# Patient Record
Sex: Male | Born: 1985
Health system: Southern US, Community
[De-identification: ages and names within clinical notes are randomized; demographics above are authoritative.]

## PROBLEM LIST (undated history)

## (undated) DIAGNOSIS — R768 Other specified abnormal immunological findings in serum: Secondary | ICD-10-CM

## (undated) DIAGNOSIS — R7401 Elevation of levels of liver transaminase levels: Secondary | ICD-10-CM

## (undated) DIAGNOSIS — R638 Other symptoms and signs concerning food and fluid intake: Secondary | ICD-10-CM

## (undated) DIAGNOSIS — E785 Hyperlipidemia, unspecified: Secondary | ICD-10-CM

## (undated) DIAGNOSIS — K589 Irritable bowel syndrome without diarrhea: Secondary | ICD-10-CM

## (undated) DIAGNOSIS — J45909 Unspecified asthma, uncomplicated: Secondary | ICD-10-CM

## (undated) HISTORY — DX: Other specified abnormal immunological findings in serum: R76.8

## (undated) HISTORY — DX: Elevation of levels of liver transaminase levels: R74.01

## (undated) HISTORY — DX: Hyperlipidemia, unspecified: E78.5

## (undated) HISTORY — PX: WISDOM TOOTH EXTRACTION: SHX21

## (undated) HISTORY — DX: Other symptoms and signs concerning food and fluid intake: R63.8

---
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
## 2009-09-27 ENCOUNTER — Emergency Department: Payer: Self-pay | Admitting: Internal Medicine

## 2013-04-21 ENCOUNTER — Emergency Department: Payer: Self-pay | Admitting: Emergency Medicine

## 2015-03-02 ENCOUNTER — Emergency Department
Admission: EM | Admit: 2015-03-02 | Discharge: 2015-03-02 | Disposition: A | Discharge status: Home / Self Care | Payer: Self-pay

## 2015-03-02 ENCOUNTER — Encounter: Payer: Self-pay | Admitting: General Practice

## 2015-03-02 HISTORY — DX: Unspecified asthma, uncomplicated: J45.909

## 2015-03-02 HISTORY — DX: Irritable bowel syndrome, unspecified: K58.9

## 2015-03-02 NOTE — ED Notes (Signed)
Pt informed to return if any life threatening symptoms occur.  

## 2015-03-02 NOTE — ED Notes (Signed)
Pt informed to return if any life threatening symptoms occur.  Pt discharged from
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
## 2015-04-27 ENCOUNTER — Encounter: Payer: Self-pay | Admitting: Family Medicine

## 2015-04-27 ENCOUNTER — Ambulatory Visit (INDEPENDENT_AMBULATORY_CARE_PROVIDER_SITE_OTHER): Payer: BLUE CROSS/BLUE SHIELD | Admitting: Family Medicine

## 2015-04-27 VITALS — BP 139/84 | HR 71 | Temp 97.9°F | Resp 16 | Ht 78.0 in | Wt 273.6 lb

## 2015-04-27 DIAGNOSIS — B009 Herpesviral infection, unspecified: Secondary | ICD-10-CM

## 2015-04-27 MED ORDER — VALACYCLOVIR HCL 500 MG PO TABS: 500.0000 mg | ORAL_TABLET | Freq: Every day | ORAL | Status: DC

## 2015-04-27 NOTE — Progress Notes (Signed)
Subjective:    Patient ID: David Luna, male    DOB: 1986/05/27, 29 y.o.   MRN: [id number]  HPI: David Luna is a 29 y.o. male presenting on 04/27/2015 for Medication Refill   HPI  Pt presents to refill suppressive therapy for herpes. He has had no outbreaks. Maintains monogamous relationship with discordant partner and they would both like him to stay on therapy. No side effects from medication tolerating well. He has not done a trial off the medication.   Past Medical History  Diagnosis Date  . IBS (irritable bowel syndrome)   . HSV-2 seropositive   . Asthma     As a small child. Pt reports he outgrew.     No current outpatient prescriptions on file prior to visit.   No current facility-administered medications on file prior to visit.    Review of Systems  Constitutional: Negative for fever and chills.  HENT: Negative.   Respiratory: Negative for chest tightness, shortness of breath and wheezing.   Cardiovascular: Negative for chest pain, palpitations and leg swelling.  Gastrointestinal: Negative for nausea, vomiting and abdominal pain.  Endocrine: Negative.   Genitourinary: Negative for dysuria, urgency, discharge, penile pain and testicular pain.  Musculoskeletal: Negative for back pain, joint swelling and arthralgias.  Skin: Negative.   Neurological: Negative for dizziness, weakness, numbness and headaches.  Psychiatric/Behavioral: Negative for sleep disturbance and dysphoric mood.   Per HPI unless specifically indicated above     Objective:    BP 139/84 mmHg  Pulse 71  Temp(Src) 97.9 F (36.6 C) (Oral)  Resp 16  Ht 6\' 6"  (1.981 m)  Wt 273 lb 9.6 oz (124.104 kg)  BMI 31.62 kg/m2  Wt Readings from Last 3 Encounters:  04/27/15 273 lb 9.6 oz (124.104 kg)  03/02/15 255 lb (115.667 kg)    Physical Exam  Constitutional: He is oriented to person, place, and time. He appears well-developed and well-nourished. No distress.  HENT:  Head: Normocephalic  and atraumatic.  Neck: Normal range of motion. Neck supple.  Cardiovascular: Normal rate, regular rhythm and normal heart sounds.  Exam reveals no gallop and no friction rub.   No murmur heard. Pulmonary/Chest: Effort normal and breath sounds normal. He has no wheezes.  Musculoskeletal: Normal range of motion. He exhibits no edema or tenderness.  Neurological: He is alert and oriented to person, place, and time. He has normal reflexes.  Skin: Skin is warm and dry. No rash noted. No erythema.  Psychiatric: He has a normal mood and affect. His behavior is normal. Thought content normal.   No results found for this or any previous visit.    Assessment & Plan:   Problem List Items Addressed This Visit      Other   Herpes simplex type 2 infection - Primary    Pt would like to continue suppressive therapy due to discordant partner. Discussed risks and benefits. Suggested trial  off medication to see if outbreaks occur. Pt plans to stop medication if he is ever out of relationship.  Renewed valtrex once daily.       Relevant Medications   valACYclovir (VALTREX) 500 MG tablet      Meds ordered this encounter  Medications  . DISCONTD: acyclovir (ZOVIRAX) 200 MG capsule    Sig: Take by mouth daily.  . valACYclovir (VALTREX) 500 MG tablet    Sig: Take 1 tablet (500 mg total) by mouth daily.    Dispense:  30 tablet    Refill:  11    Order Specific Question:  Supervising Provider    Answer:  Janeann Forehand [[id number]]      Follow up plan: Return if symptoms worsen or fail to improve.

## 2015-04-27 NOTE — Patient Instructions (Signed)
Keep taking the medication daily for suppressive therapy. You may stop taking the medication when you are not in a relationship or for a trial to see if you have an outbreak. I recommend using protection for all sexual contact at that time.

## 2015-04-27 NOTE — Assessment & Plan Note (Signed)
Pt would like to continue suppressive therapy due to discordant partner. Discussed risks and benefits. Suggested trial  off medication to see if outbreaks occur. Pt plans to stop medication if he is ever out of relationship.  Renewed valtrex once daily.

## 2016-08-10 ENCOUNTER — Emergency Department (HOSPITAL_COMMUNITY)
Admission: EM | Admit: 2016-08-10 | Discharge: 2016-08-11 | Disposition: A | Discharge status: Home / Self Care | Payer: BLUE CROSS/BLUE SHIELD | Attending: Emergency Medicine | Admitting: Emergency Medicine

## 2016-08-10 ENCOUNTER — Emergency Department (HOSPITAL_COMMUNITY): Payer: BLUE CROSS/BLUE SHIELD

## 2016-08-10 ENCOUNTER — Encounter (HOSPITAL_COMMUNITY): Payer: Self-pay | Admitting: Emergency Medicine

## 2016-08-10 DIAGNOSIS — Y9389 Activity, other specified: Secondary | ICD-10-CM | POA: Diagnosis not present

## 2016-08-10 DIAGNOSIS — S91114A Laceration without foreign body of right lesser toe(s) without damage to nail, initial encounter: Secondary | ICD-10-CM | POA: Diagnosis not present

## 2016-08-10 DIAGNOSIS — W228XXA Striking against or struck by other objects, initial encounter: Secondary | ICD-10-CM | POA: Insufficient documentation

## 2016-08-10 DIAGNOSIS — T1490XA Injury, unspecified, initial encounter: Secondary | ICD-10-CM

## 2016-08-10 DIAGNOSIS — J45909 Unspecified asthma, uncomplicated: Secondary | ICD-10-CM | POA: Diagnosis not present

## 2016-08-10 DIAGNOSIS — Y999 Unspecified external cause status: Secondary | ICD-10-CM | POA: Diagnosis not present

## 2016-08-10 DIAGNOSIS — Y929 Unspecified place or not applicable: Secondary | ICD-10-CM | POA: Diagnosis not present

## 2016-08-10 DIAGNOSIS — M79671 Pain in right foot: Secondary | ICD-10-CM | POA: Diagnosis not present

## 2016-08-10 NOTE — ED Triage Notes (Signed)
Patient was making the bed and hit right second toe. Patient got cut. Patient does not know what he got cut with. Patient came because he states he could not get it to stop bleeding.

## 2016-08-11 MED ORDER — LIDOCAINE HCL (PF) 1 % IJ SOLN
5.0000 mL | Freq: Once | INTRAMUSCULAR | Status: DC
  Filled 2016-08-11: qty 30

## 2016-08-11 NOTE — Discharge Instructions (Signed)
Please keep the wound clean and dry for the next 24 hours. May clean with mild soap and water do not scrub. Do not soak the toe in water. He may apply antibiotic ointment as needed. The stitches will need to come out in 10-14 days. Watch out for signs of infection including redness, swelling, draining of thick fluid. Return to the ED if you develop any of these symptoms. Follow up with her primary care doctor for wound recheck next week.

## 2016-08-11 NOTE — ED Provider Notes (Signed)
[facility]-EMERGENCY DEPT Provider Note   CSN: [id number] Arrival date & time: 08/10/16  2309     History   Chief Complaint Chief Complaint  Patient presents with  . Toe Injury    HPI David Luna is a 30 y.o. male.   30 year old Caucasian male with no significant past medical history presents to the ED today with right second toe laceration that occurred pta. Patient states that he was making the bed tonight his right second toe on the bed frame. He was unable to control bleeding which is why he came to the ED. Pain is minimal. Patient has full range of motion and sensation of the toe. Pain was acute in onset. Nothing makes better or worse. He is not taking anything for the pain at home. States his tetanus is up-to-date. Denies any symptoms at this time.       Past Medical History:  Diagnosis Date  . Asthma    As a small child. Pt reports he outgrew.   Marland Kitchen HSV-2 seropositive   . IBS (irritable bowel syndrome)     Patient Active Problem List   Diagnosis Date Noted  . Herpes simplex type 2 infection 04/27/2015    History reviewed. No pertinent surgical history.     Home Medications    Prior to Admission medications   Medication Sig Start Date End Date Taking? Authorizing Provider  valACYclovir (VALTREX) 500 MG tablet Take 1 tablet (500 mg total) by mouth daily. 04/27/15   Amy Rusty Aus, NP    Family History Family History  Problem Relation Age of Onset  . Hypertension Mother     Social History Social History  Substance Use Topics  . Smoking status: Never Smoker  . Smokeless tobacco: Never Used  . Alcohol use Yes     Comment: Casual     Allergies   Patient has no known allergies.   Review of Systems Review of Systems  Constitutional: Negative for chills and fever.  Musculoskeletal: Negative for arthralgias and joint swelling.  Skin: Positive for wound.  All other systems reviewed and are negative.    Physical Exam Updated Vital Signs BP  127/89 (BP Location: Right Arm)   Pulse 76   Temp 97.7 F (36.5 C) (Oral)   Resp 18   Ht 6\' 6"  (1.981 m)   Wt 124.3 kg   SpO2 97%   BMI 31.66 kg/m   Physical Exam  Constitutional: He appears well-developed and well-nourished. No distress.  Eyes: Right eye exhibits no discharge. Left eye exhibits no discharge. No scleral icterus.  Pulmonary/Chest: No respiratory distress.  Musculoskeletal: Normal range of motion.  1 cm laceration noted to the right second toe that does not involve the nailbed. The nail and nailbed are intact. Laceration is to the right lateral side of the toe. Very superficial. Wound is more of an avulsion with a skin flap. Bleeding is controlled. Minimal edema noted. No erythema or ecchymosis noted. Patient with full range of motion. Cap refill is normal. Sensation is intact. Full range of motion. DP pulses are 2+ bilaterally.  Neurological: He is alert.  Skin: Skin is warm and dry. Capillary refill takes less than 2 seconds. No pallor.  Nursing note and vitals reviewed.    ED Treatments / Results  Labs (all labs ordered are listed, but only abnormal results are displayed) Labs Reviewed - No data to display  EKG  EKG Interpretation None       Radiology Dg Foot Complete Right  Result Date: 08/11/2016 CLINICAL DATA:  Stubbed second digit with pain EXAM: RIGHT FOOT COMPLETE - 3+ VIEW COMPARISON:  None. FINDINGS: No fracture or subluxation. Soft tissue injury distal second digit. No radiopaque foreign body. IMPRESSION: No acute osseous abnormality. Electronically Signed   By: Jasmine PangKim  Fujinaga M.D.   On: 08/11/2016 00:00    Procedures .Marland Kitchen.Laceration Repair Date/Time: 08/11/2016 1:22 AM Performed by: Rise MuLEAPHART, Ashlay Altieri T Authorized by: Demetrios LollLEAPHART, Keylin Ferryman T   Consent:    Consent obtained:  Verbal   Consent given by:  Patient   Risks discussed:  Infection, need for additional repair, nerve damage, pain, poor cosmetic result, poor wound healing, retained foreign  body, tendon damage and vascular damage   Alternatives discussed:  No treatment Anesthesia (see MAR for exact dosages):    Anesthesia method:  Nerve block   Block location:  Right second toe at the base   Block needle gauge:  25 G   Block anesthetic:  Lidocaine 1% w/o epi   Block technique:  Injection at the base on each side of the toe   Block injection procedure:  Anatomic landmarks identified, introduced needle, anatomic landmarks palpated, negative aspiration for blood and incremental injection   Block outcome:  Anesthesia achieved Laceration details:    Location:  Toe   Toe location:  R second toe   Length (cm):  1   Depth (mm):  1 Repair type:    Repair type:  Simple Pre-procedure details:    Preparation:  Patient was prepped and draped in usual sterile fashion and imaging obtained to evaluate for foreign bodies Exploration:    Hemostasis achieved with:  Direct pressure   Wound exploration: wound explored through full range of motion and entire depth of wound probed and visualized     Wound extent: no foreign bodies/material noted, no nerve damage noted and no tendon damage noted     Contaminated: no   Treatment:    Area cleansed with:  Betadine, Hibiclens and saline   Amount of cleaning:  Extensive   Irrigation solution:  Sterile saline   Irrigation volume:  50cc   Irrigation method:  Pressure wash   Visualized foreign bodies/material removed: no   Skin repair:    Repair method:  Sutures   Suture size:  6-0   Suture material:  Prolene   Number of sutures:  3 Approximation:    Approximation:  Loose   Vermilion border: poorly aligned   Post-procedure details:    Dressing:  Antibiotic ointment and bulky dressing   Patient tolerance of procedure:  Tolerated well, no immediate complications Comments:     Taced skin flap down with 3 sutures. Follow up in 10-14 days for suture removal.   (including critical care time)  Medications Ordered in ED Medications  lidocaine  (PF) (XYLOCAINE) 1 % injection 5 mL (not administered)     Initial Impression / Assessment and Plan / ED Course  I have reviewed the triage vital signs and the nursing notes.  Pertinent labs & imaging results that were available during my care of the patient were reviewed by me and considered in my medical decision making (see chart for details).  Clinical Course   UTD on Tdap. Pressure irrigation performed. Laceration occurred < 8 hours prior to repair which was well tolerated. Pt has no co morbidities to effect normal wound healing. Xray unremarkable. Discussed suture home care w pt and answered questions. Pt to f-u for wound check and suture removal in 10-14 days. Pt  is hemodynamically stable w no complaints prior to dc.  Patient seen and examined by Dr. Eudelia Bunchardama who is agreeable to the above plan.   Final Clinical Impressions(s) / ED Diagnoses   Final diagnoses:  Laceration of lesser toe of right foot without foreign body present or damage to nail, initial encounter    New Prescriptions New Prescriptions   No medications on file     Rise MuKenneth T Koralyn Prestage, PA-C 08/11/16 0128    Nira ConnPedro Eduardo Cardama, MD 08/11/16 1500

## 2016-08-11 NOTE — ED Notes (Signed)
PA at bedside.

## 2016-08-11 NOTE — ED Notes (Signed)
Pt injured toe cleaned with betadine.

## 2016-11-14 DIAGNOSIS — L570 Actinic keratosis: Secondary | ICD-10-CM | POA: Diagnosis not present

## 2016-11-15 ENCOUNTER — Ambulatory Visit: Payer: BLUE CROSS/BLUE SHIELD | Admitting: Dietician

## 2016-11-22 ENCOUNTER — Encounter: Payer: Self-pay | Admitting: Dietician

## 2016-11-22 ENCOUNTER — Encounter: Payer: BLUE CROSS/BLUE SHIELD | Attending: Family Medicine | Admitting: Dietician

## 2016-11-22 VITALS — Ht 78.0 in | Wt 278.0 lb

## 2016-11-22 DIAGNOSIS — Z6832 Body mass index (BMI) 32.0-32.9, adult: Secondary | ICD-10-CM | POA: Diagnosis not present

## 2016-11-22 DIAGNOSIS — E6609 Other obesity due to excess calories: Secondary | ICD-10-CM | POA: Diagnosis not present

## 2016-11-22 NOTE — Progress Notes (Signed)
Medical Nutrition Therapy: Visit start time: 9:40 am   end time: 10:40 Assessment:  Diagnosis: obesity  Psychosocial issues/ stress concerns: Patient describes his stress as moderate and indicates "very well" as to how well he is dealing with his stress.  Preferred learning method:  . Auditory Current weight: 278 lbs (wt. at home) Height: 78 in Medications, supplements:see list Progress and evaluation:  Patient in for initial medical nutrition therapy appointment. 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 He often packs his lunch when working. He eats out 7-8 times per week and states he typically makes high fat choices and has larger portions. He chooses sweetened beverages when eating "out". He and his wife have recently been trying the" mail order meals" for dinner which he states is helping with portion control. Typically drinks 1 soda daily at home but water otherwise for meals/snacks at home. He drinks 5-10 alcohol beverages weekly. His overall diet is low in fruits/vegetables, whole grains and calcium sources. He states he is interested in weight loss but wants to focus more on an overall healthier diet to improve health. He likes a limited variety of vegetables and fruits.   Physical activity: weights/cardio 2-4 times per wee for 45-60 minutes.  Dietary Intake:  Usual eating pattern includes 2-3 meals and 1-2 snacks per day. Dining out frequency: 7-8 meals per week.  Breakfast:yogurt, almonds, honey or Fast Food biscuits, potatoes when eating out,  20 oz. [place] Lunch:11:30- salad with onions, mushrooms, bacon, lite dressing, rice or quinoa with chicken; green beans or asparagus or Kale Snack: almonds; not daily Supper: 7:30pm ground beef, baked potato or tacos or spaghetti, salad or pizza or [place] Beverages: water- 4-5 cups per day, soda  (20oz) daily; more when dining out  Nutrition Care Education: Basic nutrition/ weight control: Instructed on basic food group servings to meet nutrient needs. Encouraged to focus on foods needed to meet nutrient needs verses restriction. Use food guide plate and "Planning a balanced meal" to show how to be more mindful of balancing protein, carbohydrate and non-starchy vegetables. Discussed practical ways to include more fruits/vegetables. Discussed strategies when dining out to decrease calories. Discussed phone apps to look up nutrition information especially for meals eaten "out" to help increase mindfulness in order to make better choices   Nutritional Diagnosis:  NI-1.5 Excessive energy intake As related to sweetened beverages, high fat choices when dining out and large portions.  As evidenced by diet history.. NI-5.11.1 Predicted suboptimal nutrient intake As related to low intake of fruits, vegetables, whole grains.  As evidenced by diet history.  Intervention:  Focus on healthier choices when eating "out". Limit fried foods. Work to add fruits/vegetables to meals. Try more dark green lettuce types such as romaine, green leafy or use spinach as salad base. Add grated carrots, purple cabbage etc to increase nutrients. Work to balance meals with protein, 3-5 servings of carbohydrate (45-75 gms), and non-starchy vegetables. Choose water or unsweetened tea when dining out. Use phone apps to look up nutrition information when eating out. Myfitnesspal for example   Education Materials given:  . Food lists/ Planning A Balanced Meal . Sample meal pattern/ menus . Goals/ instructions  Learner/ who was taught:  . Patient   Level of understanding: . Partial understanding; needs review/ practice Demonstrated degree of understanding via:  Teach back Learning barriers: . None  Willingness to learn/ readiness for change: . Acceptance, ready for change  Monitoring and Evaluation:   Dietary intake, exercise, , and body weight      follow up: 01/01/17 at 9:00am

## 2016-11-22 NOTE — Patient Instructions (Addendum)
Focus on healthier choices when eating "out". Limit fried foods. Work to add fruits/vegetables to meals. Try more dark green lettuce types such as romaine, green leafy or use spinach as salad base. Add grated carrots, purple cabbage etc to increase nutrients. Work to balance meals with protein, 3-5 servings of carbohydrate (45-75 gms), and non-starchy vegetables. Choose water or unsweetened tea when dining out. Use phone apps to look up nutrition information when eating out. Myfitnesspal for example

## 2017-01-01 ENCOUNTER — Ambulatory Visit: Payer: BLUE CROSS/BLUE SHIELD | Admitting: Dietician

## 2017-01-07 ENCOUNTER — Ambulatory Visit: Payer: BLUE CROSS/BLUE SHIELD | Admitting: Dietician

## 2017-01-09 DIAGNOSIS — E669 Obesity, unspecified: Secondary | ICD-10-CM | POA: Diagnosis not present

## 2017-01-09 DIAGNOSIS — E7800 Pure hypercholesterolemia, unspecified: Secondary | ICD-10-CM | POA: Diagnosis not present

## 2017-01-09 DIAGNOSIS — Z79899 Other long term (current) drug therapy: Secondary | ICD-10-CM | POA: Diagnosis not present

## 2017-01-09 DIAGNOSIS — K219 Gastro-esophageal reflux disease without esophagitis: Secondary | ICD-10-CM | POA: Diagnosis not present

## 2017-01-09 DIAGNOSIS — N529 Male erectile dysfunction, unspecified: Secondary | ICD-10-CM | POA: Diagnosis not present

## 2017-01-09 DIAGNOSIS — I10 Essential (primary) hypertension: Secondary | ICD-10-CM | POA: Diagnosis not present

## 2017-01-17 ENCOUNTER — Encounter: Payer: Self-pay | Admitting: Dietician

## 2017-03-27 DIAGNOSIS — Z3189 Encounter for other procreative management: Secondary | ICD-10-CM | POA: Diagnosis not present

## 2017-05-29 DIAGNOSIS — L570 Actinic keratosis: Secondary | ICD-10-CM | POA: Diagnosis not present

## 2017-06-21 DIAGNOSIS — Z23 Encounter for immunization: Secondary | ICD-10-CM | POA: Diagnosis not present

## 2017-07-24 DIAGNOSIS — I10 Essential (primary) hypertension: Secondary | ICD-10-CM | POA: Diagnosis not present

## 2017-07-24 DIAGNOSIS — K219 Gastro-esophageal reflux disease without esophagitis: Secondary | ICD-10-CM | POA: Diagnosis not present

## 2017-07-24 DIAGNOSIS — E7800 Pure hypercholesterolemia, unspecified: Secondary | ICD-10-CM | POA: Diagnosis not present

## 2017-08-08 IMAGING — CR DG FOOT COMPLETE 3+V*R*
3 series · 3 of 3 positions shown · non-contrast
Comparison: None.

CLINICAL DATA: Stubbed second digit with pain

EXAM:
RIGHT FOOT COMPLETE - 3+ VIEW

[x foot ap right]
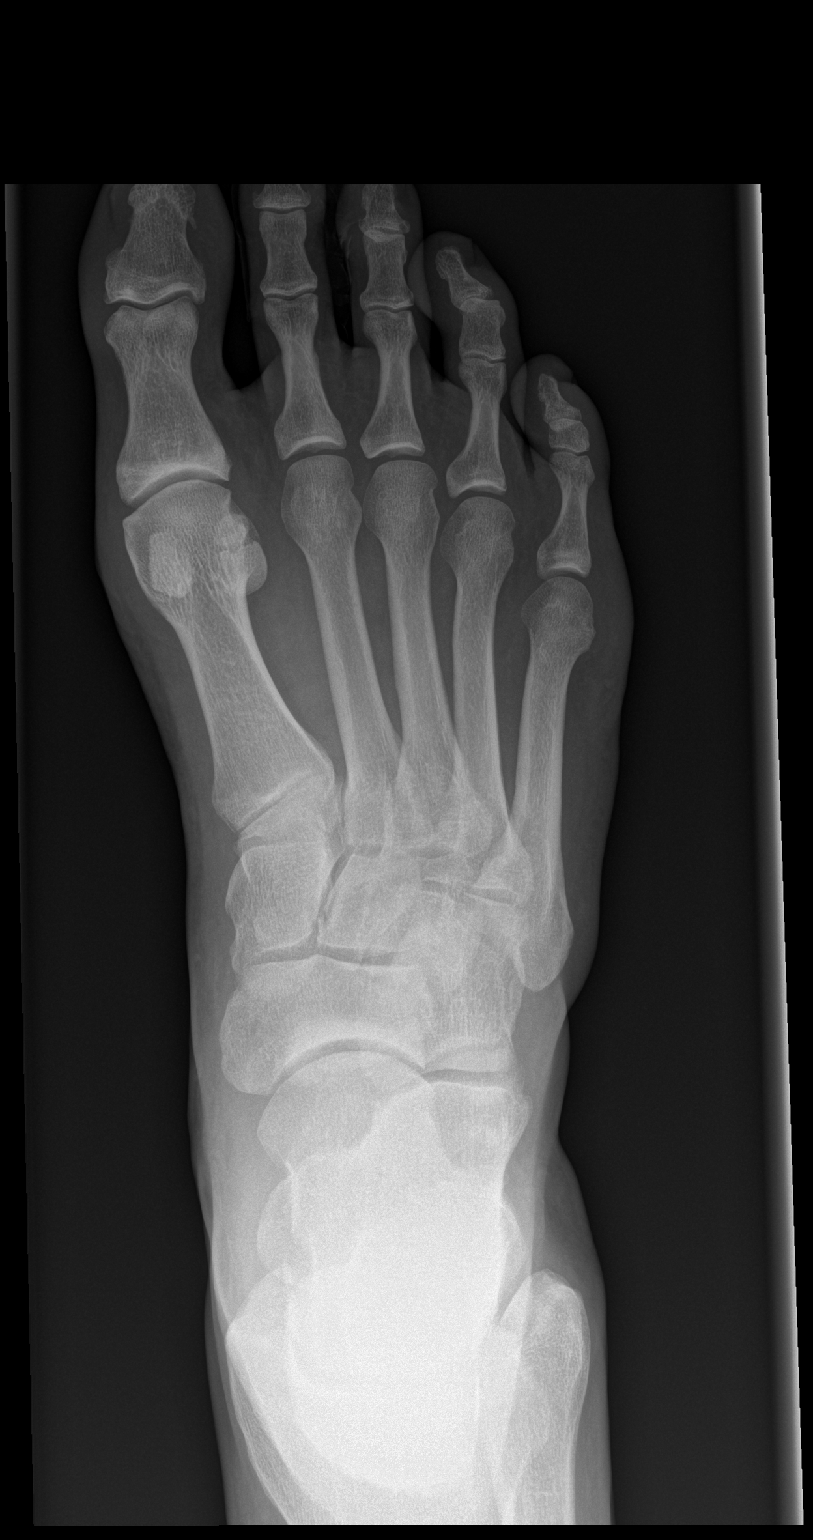

[x foot obl right]
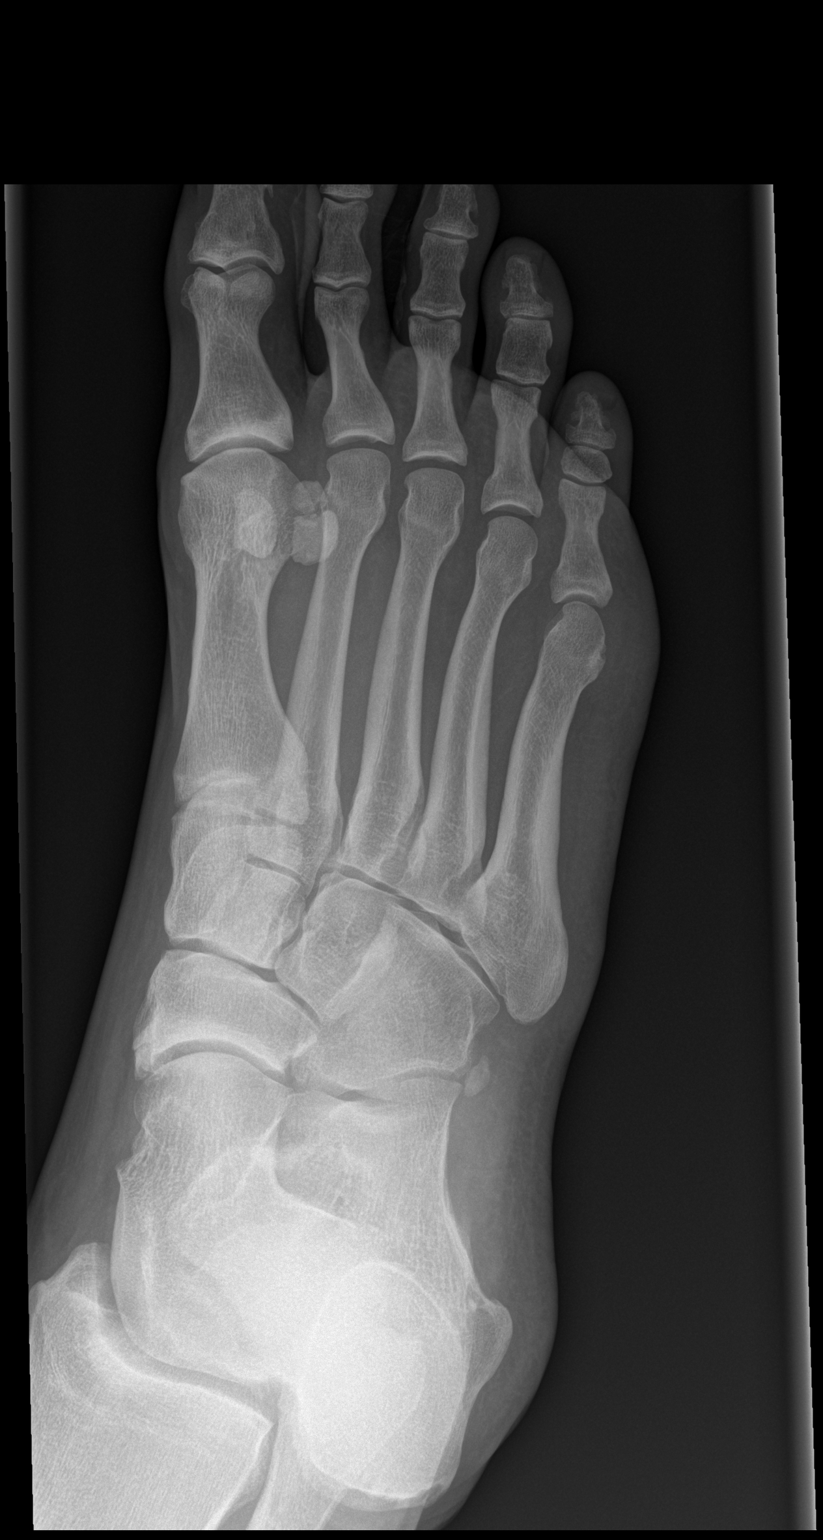

[x foot lat right]
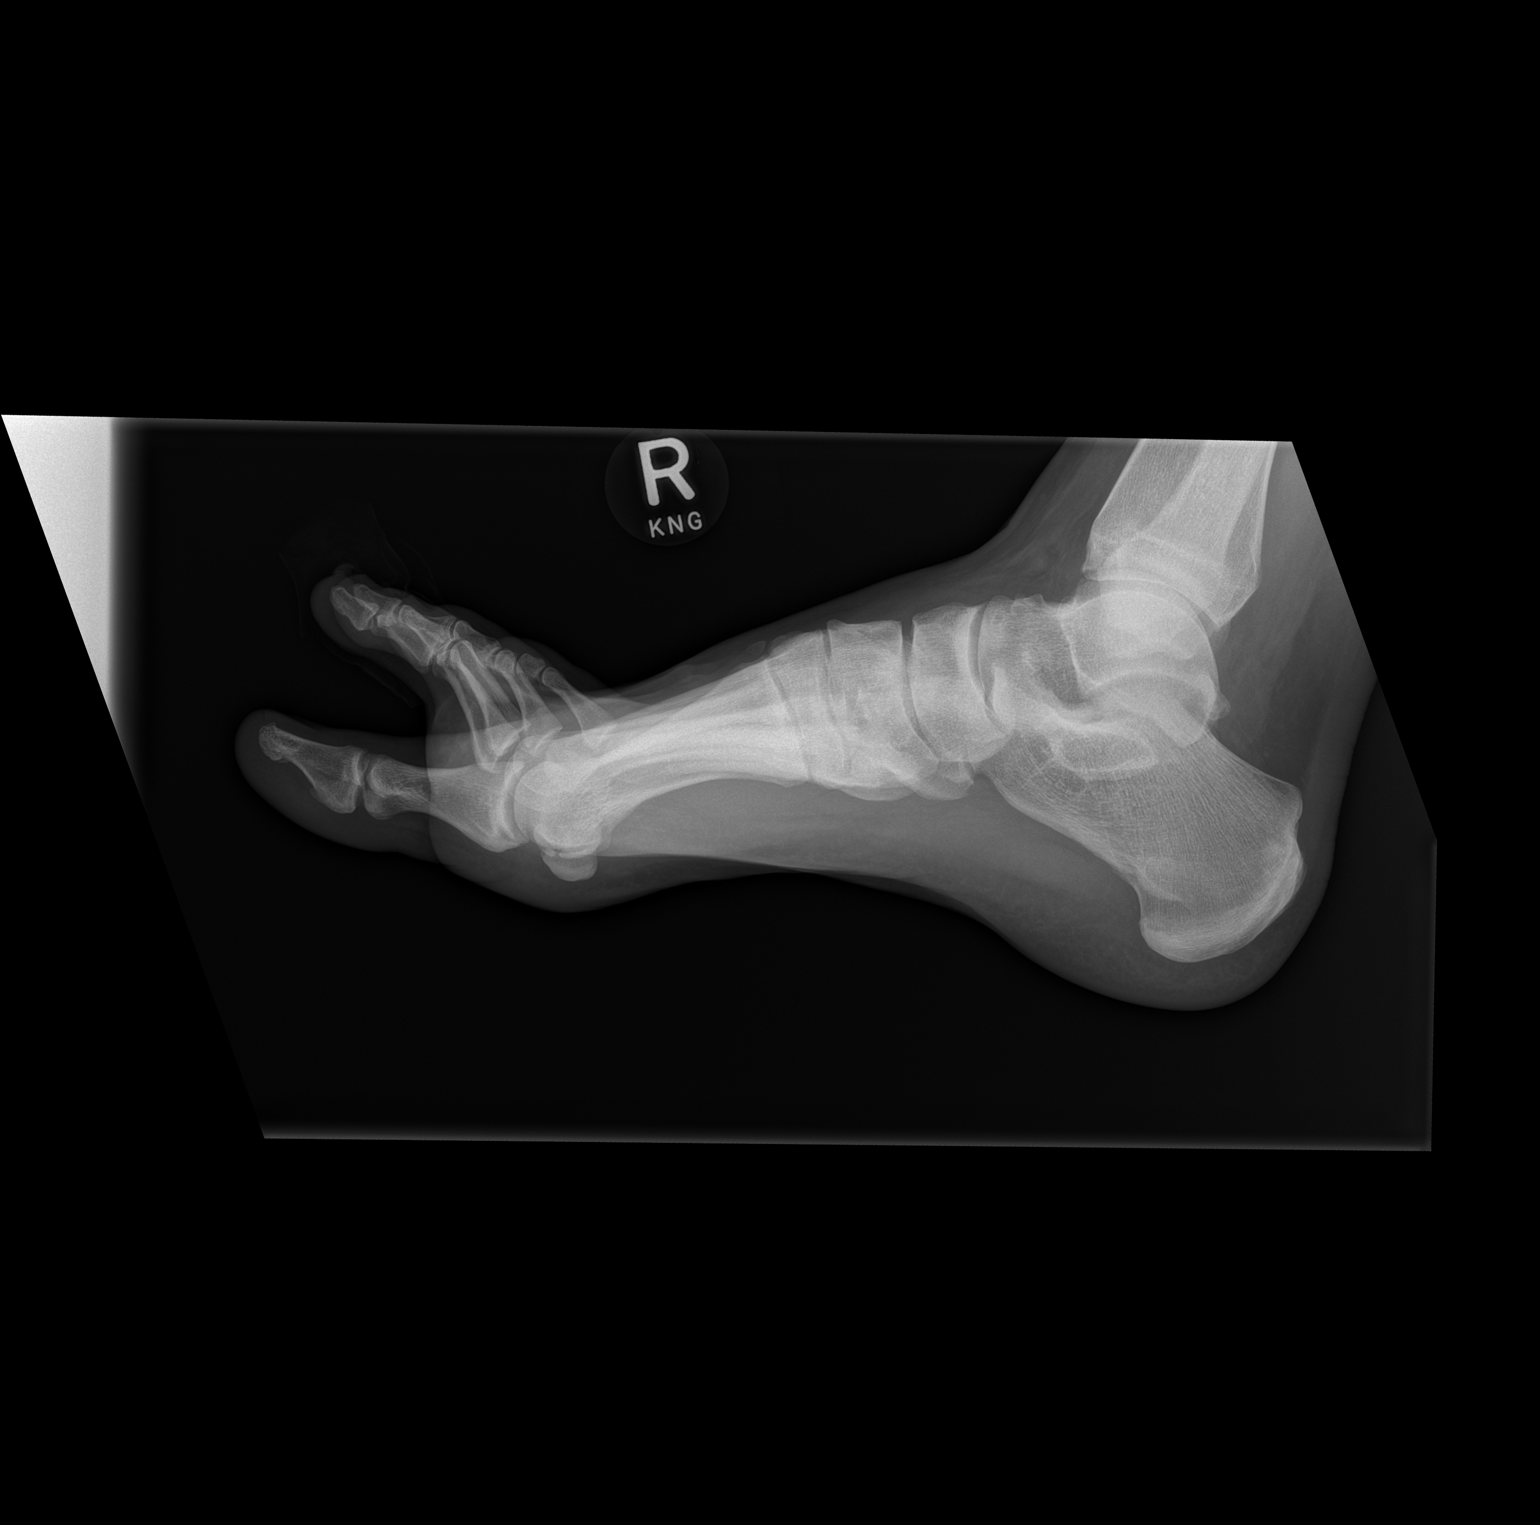

[3 of 3 positions shown; findings below may reference images not displayed]

FINDINGS: No fracture or subluxation. Soft tissue injury distal second digit.
No radiopaque foreign body.
IMPRESSION: No acute osseous abnormality.

## 2017-08-12 DIAGNOSIS — H6120 Impacted cerumen, unspecified ear: Secondary | ICD-10-CM | POA: Diagnosis not present

## 2017-08-12 DIAGNOSIS — Z1212 Encounter for screening for malignant neoplasm of rectum: Secondary | ICD-10-CM | POA: Diagnosis not present

## 2017-08-12 DIAGNOSIS — Z0000 Encounter for general adult medical examination without abnormal findings: Secondary | ICD-10-CM | POA: Diagnosis not present

## 2017-08-12 DIAGNOSIS — E669 Obesity, unspecified: Secondary | ICD-10-CM | POA: Diagnosis not present

## 2017-08-12 DIAGNOSIS — G4700 Insomnia, unspecified: Secondary | ICD-10-CM | POA: Diagnosis not present

## 2017-08-12 DIAGNOSIS — Z79899 Other long term (current) drug therapy: Secondary | ICD-10-CM | POA: Diagnosis not present

## 2017-08-12 DIAGNOSIS — Z23 Encounter for immunization: Secondary | ICD-10-CM | POA: Diagnosis not present

## 2017-08-21 DIAGNOSIS — M799 Soft tissue disorder, unspecified: Secondary | ICD-10-CM | POA: Diagnosis not present

## 2017-08-21 DIAGNOSIS — Z6834 Body mass index (BMI) 34.0-34.9, adult: Secondary | ICD-10-CM | POA: Diagnosis not present

## 2017-08-24 DIAGNOSIS — R1901 Right upper quadrant abdominal swelling, mass and lump: Secondary | ICD-10-CM | POA: Diagnosis not present

## 2017-08-24 DIAGNOSIS — M799 Soft tissue disorder, unspecified: Secondary | ICD-10-CM | POA: Diagnosis not present

## 2017-12-17 DIAGNOSIS — L570 Actinic keratosis: Secondary | ICD-10-CM | POA: Diagnosis not present

## 2018-02-07 DIAGNOSIS — I10 Essential (primary) hypertension: Secondary | ICD-10-CM | POA: Diagnosis not present

## 2018-02-07 DIAGNOSIS — K219 Gastro-esophageal reflux disease without esophagitis: Secondary | ICD-10-CM | POA: Diagnosis not present

## 2018-02-07 DIAGNOSIS — E7800 Pure hypercholesterolemia, unspecified: Secondary | ICD-10-CM | POA: Diagnosis not present

## 2018-02-12 DIAGNOSIS — Z1331 Encounter for screening for depression: Secondary | ICD-10-CM | POA: Diagnosis not present

## 2018-02-12 DIAGNOSIS — K219 Gastro-esophageal reflux disease without esophagitis: Secondary | ICD-10-CM | POA: Diagnosis not present

## 2018-02-12 DIAGNOSIS — Z79899 Other long term (current) drug therapy: Secondary | ICD-10-CM | POA: Diagnosis not present

## 2018-02-12 DIAGNOSIS — N529 Male erectile dysfunction, unspecified: Secondary | ICD-10-CM | POA: Diagnosis not present

## 2018-02-12 DIAGNOSIS — E7800 Pure hypercholesterolemia, unspecified: Secondary | ICD-10-CM | POA: Diagnosis not present

## 2018-02-12 DIAGNOSIS — E669 Obesity, unspecified: Secondary | ICD-10-CM | POA: Diagnosis not present

## 2018-02-12 DIAGNOSIS — I10 Essential (primary) hypertension: Secondary | ICD-10-CM | POA: Diagnosis not present

## 2018-02-27 LAB — LIPID PANEL
Cholesterol: 256 — AB (ref 0–200)
HDL: 44 (ref 35–70)
LDL Cholesterol: 178
Triglycerides: 170 — AB (ref 40–160)

## 2018-02-27 LAB — HEPATIC FUNCTION PANEL
ALT: 45 — AB (ref 10–40)
AST: 22 (ref 14–40)

## 2018-06-17 DIAGNOSIS — L570 Actinic keratosis: Secondary | ICD-10-CM | POA: Diagnosis not present

## 2018-07-03 DIAGNOSIS — Z23 Encounter for immunization: Secondary | ICD-10-CM | POA: Diagnosis not present

## 2018-07-21 DIAGNOSIS — L723 Sebaceous cyst: Secondary | ICD-10-CM | POA: Diagnosis not present

## 2018-07-21 DIAGNOSIS — L821 Other seborrheic keratosis: Secondary | ICD-10-CM | POA: Diagnosis not present

## 2018-08-15 DIAGNOSIS — I10 Essential (primary) hypertension: Secondary | ICD-10-CM | POA: Diagnosis not present

## 2018-08-15 DIAGNOSIS — K219 Gastro-esophageal reflux disease without esophagitis: Secondary | ICD-10-CM | POA: Diagnosis not present

## 2018-08-15 DIAGNOSIS — E7800 Pure hypercholesterolemia, unspecified: Secondary | ICD-10-CM | POA: Diagnosis not present

## 2018-08-20 DIAGNOSIS — Z79899 Other long term (current) drug therapy: Secondary | ICD-10-CM | POA: Diagnosis not present

## 2018-08-20 DIAGNOSIS — E669 Obesity, unspecified: Secondary | ICD-10-CM | POA: Diagnosis not present

## 2018-08-20 DIAGNOSIS — K219 Gastro-esophageal reflux disease without esophagitis: Secondary | ICD-10-CM | POA: Diagnosis not present

## 2018-08-20 DIAGNOSIS — E7800 Pure hypercholesterolemia, unspecified: Secondary | ICD-10-CM | POA: Diagnosis not present

## 2018-08-20 DIAGNOSIS — I10 Essential (primary) hypertension: Secondary | ICD-10-CM | POA: Diagnosis not present

## 2018-08-20 DIAGNOSIS — N529 Male erectile dysfunction, unspecified: Secondary | ICD-10-CM | POA: Diagnosis not present

## 2018-09-02 LAB — LIPID PANEL
Cholesterol: 221 — AB (ref 0–200)
HDL: 37 (ref 35–70)
LDL Cholesterol: 163
Triglycerides: 103 (ref 40–160)

## 2018-09-02 LAB — HEPATIC FUNCTION PANEL
ALT: 53 — AB (ref 10–40)
AST: 25 (ref 14–40)

## 2018-12-06 NOTE — Progress Notes (Signed)
Subjective:    Patient ID: David Luna, male    DOB: 24-Feb-1986, 33 y.o.   MRN: [id number]  HPI: David Luna is here to establish as a new pt.  He is a pleasant 33 year old male. 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 Healthcare maintenance 12/08/2018  . BMI 33.0-33.9,adult 12/08/2018  . Herpes simplex type 2 infection 04/27/2015     Past Medical History:  Diagnosis Date  . Asthma    As a small child. Pt reports he outgrew.   Marland Kitchen HSV-2 seropositive   . IBS (irritable bowel syndrome)      History reviewed. No pertinent surgical history.   Family History  Problem Relation Age of Onset  . Hypertension Mother      Social History   Substance and Sexual Activity  Drug Use No     Social History   Substance and Sexual Activity  Alcohol Use Yes  . Alcohol/week: 5.0 standard drinks  . Types: 5 Cans of beer per week     Social History   Tobacco Use  Smoking Status Never Smoker  Smokeless Tobacco Never Used     Outpatient Encounter Medications as of 12/08/2018  Medication Sig  . valACYclovir (VALTREX) 500 MG tablet Take 1 tablet (500 mg total) by mouth daily.   No facility-administered encounter medications on file as of 12/08/2018.     Allergies: Patient has no known allergies.  Body mass index is 33 kg/m.  Blood pressure 119/70, pulse 66,  temperature 97.8 F (36.6 C), temperature source Oral, height 6\' 6"  (1.981 m), weight 285 lb 9.6 oz (129.5 kg), SpO2 96 %.  Review of Systems  Constitutional: Positive for fatigue. Negative for activity change, appetite change, chills, diaphoresis, fever and unexpected weight change.  HENT: Negative for congestion.   Eyes: Negative for visual disturbance.  Respiratory: Negative for cough, chest tightness, shortness of breath, wheezing and stridor.   Cardiovascular: Negative for chest pain, palpitations and leg swelling.  Gastrointestinal: Negative for abdominal distention, anal bleeding, constipation, diarrhea, nausea and vomiting.  Endocrine: Negative for cold intolerance, heat intolerance, polydipsia, polyphagia and polyuria.  Genitourinary: Negative for difficulty urinating and frequency.  Musculoskeletal: Negative for arthralgias, back pain, gait problem, joint swelling, myalgias, neck pain and neck stiffness.  Skin: Negative for color change, pallor, rash and wound.  Neurological: Negative for dizziness and headaches.  Hematological: Does not bruise/bleed easily.  Psychiatric/Behavioral: Negative for agitation, behavioral problems, confusion, decreased concentration, dysphoric mood, hallucinations, self-injury, sleep disturbance and suicidal ideas. The patient is not nervous/anxious and is not hyperactive.        Objective:   Physical Exam Vitals signs reviewed.  Constitutional:      General: He is not in acute distress.    Appearance: Normal appearance. He is not ill-appearing, toxic-appearing or diaphoretic.  HENT:     Head: Normocephalic and atraumatic.  Cardiovascular:  Rate and Rhythm: Normal rate.     Pulses: Normal pulses.     Heart sounds: Normal heart sounds. No murmur. No friction rub. No gallop.   Pulmonary:     Effort: Pulmonary effort is normal. No respiratory distress.     Breath sounds: Normal breath sounds. No stridor. No wheezing, rhonchi or rales.  Chest:      Chest wall: No tenderness.  Skin:    General: Skin is warm and dry.     Capillary Refill: Capillary refill takes less than 2 seconds.  Neurological:     Mental Status: He is alert and oriented to person, place, and time.  Psychiatric:        Mood and Affect: Mood normal.        Behavior: Behavior normal.        Thought Content: Thought content normal.        Judgment: Judgment normal.       Assessment & Plan:   1. Healthcare maintenance   2. Herpes simplex type 2 infection   3. BMI 33.0-33.9,adult     Healthcare maintenance Please schedule annual follow-up appt in jan 2021 and bring paperwork from Dec Employer physical. Continue to drink plenty of fluids and follow Mediterranean diet. When you need refill on medication, please call your pharmacy and they will contact us.  Herpes simplex type 2 infection HSV2- he denies ever experiencing a genital herpes breakout, reports being dx'd via blood test 3 years ago He takes Valrex 500mg  QD for suppressive therapy, denies medication SE He denies need for medication RF today  BMI 33.0-33.9,adult Body mass index is 33 kg/m.  Current wt 285 Mediterranean diet He recently started Intermittent Fasting Continue regular Cardio Exercise     FOLLOW-UP:  Return in about 10 months (around 10/10/2019) for Regular Follow Up.

## 2018-12-08 ENCOUNTER — Other Ambulatory Visit: Payer: Self-pay

## 2018-12-08 ENCOUNTER — Encounter: Payer: Self-pay | Admitting: Adult Health

## 2018-12-08 ENCOUNTER — Ambulatory Visit (INDEPENDENT_AMBULATORY_CARE_PROVIDER_SITE_OTHER): Payer: BLUE CROSS/BLUE SHIELD | Admitting: Adult Health

## 2018-12-08 VITALS — BP 119/70 | HR 66 | Temp 97.8°F | Ht 78.0 in | Wt 285.6 lb

## 2018-12-08 DIAGNOSIS — Z6833 Body mass index (BMI) 33.0-33.9, adult: Secondary | ICD-10-CM

## 2018-12-08 DIAGNOSIS — Z0000 Encounter for general adult medical examination without abnormal findings: Secondary | ICD-10-CM | POA: Diagnosis not present

## 2018-12-08 DIAGNOSIS — B009 Herpesviral infection, unspecified: Secondary | ICD-10-CM | POA: Diagnosis not present

## 2018-12-08 NOTE — Assessment & Plan Note (Signed)
Body mass index is 33 kg/m.  Current wt 285 Mediterranean diet He recently started Intermittent Fasting Continue regular Cardio Exercise

## 2018-12-08 NOTE — Patient Instructions (Addendum)
Mediterranean Diet A Mediterranean diet refers to food and lifestyle choices that are based on the traditions of countries located on the [employer]. This way of eating has been shown to help prevent certain conditions and improve outcomes for people who have chronic diseases, like kidney disease and heart disease. What are tips for following this plan? Lifestyle  Cook and eat meals together with your family, when possible.  Drink enough fluid to keep your urine clear or pale yellow.  Be physically active every day. This includes: ? Aerobic exercise like running or swimming. ? Leisure activities like gardening, walking, or housework.  Get 7-8 hours of sleep each night.  If recommended by your health care provider, drink red wine in moderation. This means 1 glass a day for nonpregnant women and 2 glasses a day for men. A glass of wine equals 5 oz (150 mL). Reading food labels   Check the serving size of packaged foods. For foods such as rice and pasta, the serving size refers to the amount of cooked product, not dry.  Check the total fat in packaged foods. Avoid foods that have saturated fat or trans fats.  Check the ingredients list for added sugars, such as corn syrup. Shopping  At the grocery store, buy most of your food from the areas near the walls of the store. This includes: ? Fresh fruits and vegetables (produce). ? Grains, beans, nuts, and seeds. Some of these may be available in unpackaged forms or large amounts (in bulk). ? Fresh seafood. ? Poultry and eggs. ? Low-fat dairy products.  Buy whole ingredients instead of prepackaged foods.  Buy fresh fruits and vegetables in-season from local farmers markets.  Buy frozen fruits and vegetables in resealable bags.  If you do not have access to quality fresh seafood, buy precooked frozen shrimp or canned fish, such as tuna, salmon, or sardines.  Buy small amounts of raw or cooked vegetables, salads, or olives from  the deli or salad bar at your store.  Stock your pantry so you always have certain foods on hand, such as olive oil, canned tuna, canned tomatoes, rice, pasta, and beans. Cooking  Cook foods with extra-virgin olive oil instead of using butter or other vegetable oils.  Have meat as a side dish, and have vegetables or grains as your main dish. This means having meat in small portions or adding small amounts of meat to foods like pasta or stew.  Use beans or vegetables instead of meat in common dishes like chili or lasagna.  Experiment with different cooking methods. Try roasting or broiling vegetables instead of steaming or sauteing them.  Add frozen vegetables to soups, stews, pasta, or rice.  Add nuts or seeds for added healthy fat at each meal. You can add these to yogurt, salads, or vegetable dishes.  Marinate fish or vegetables using olive oil, lemon juice, garlic, and fresh herbs. Meal planning   Plan to eat 1 vegetarian meal one day each week. Try to work up to 2 vegetarian meals, if possible.  Eat seafood 2 or more times a week.  Have healthy snacks readily available, such as: ? Vegetable sticks with hummus. ? [place] yogurt. ? Fruit and nut trail mix.  Eat balanced meals throughout the week. This includes: ? Fruit: 2-3 servings a day ? Vegetables: 4-5 servings a day ? Low-fat dairy: 2 servings a day ? Fish, poultry, or lean meat: 1 serving a day ? Beans and legumes: 2 or more servings a week ?  Nuts and seeds: 1-2 servings a day ? Whole grains: 6-8 servings a day ? Extra-virgin olive oil: 3-4 servings a day  Limit red meat and sweets to only a few servings a month What are my food choices?  Mediterranean diet ? Recommended ? Grains: Whole-grain pasta. Brown rice. Bulgar wheat. Polenta. Couscous. Whole-wheat bread. Orpah Cobb. ? Vegetables: Artichokes. Beets. Broccoli. Cabbage. Carrots. Eggplant. Green beans. Chard. Kale. Spinach. Onions. Leeks. Peas. Squash.  Tomatoes. Peppers. Radishes. ? Fruits: Apples. Apricots. Avocado. Berries. Bananas. Cherries. Dates. Figs. Grapes. Lemons. Melon. Oranges. Peaches. Plums. Pomegranate. ? Meats and other protein foods: Beans. Almonds. Sunflower seeds. Pine nuts. Peanuts. Cod. Salmon. Scallops. Shrimp. Tuna. Tilapia. Clams. Oysters. Eggs. ? Dairy: Low-fat milk. Cheese. Greek yogurt. ? Beverages: Water. Red wine. Herbal tea. ? Fats and oils: Extra virgin olive oil. Avocado oil. Grape seed oil. ? Sweets and desserts: [place] yogurt with honey. Baked apples. Poached pears. Trail mix. ? Seasoning and other foods: Basil. Cilantro. Coriander. Cumin. Mint. Parsley. Sage. Rosemary. Tarragon. Garlic. Oregano. Thyme. Pepper. Balsalmic vinegar. Tahini. Hummus. Tomato sauce. Olives. Mushrooms. ? Limit these ? Grains: Prepackaged pasta or rice dishes. Prepackaged cereal with added sugar. ? Vegetables: Deep fried potatoes (french fries). ? Fruits: Fruit canned in syrup. ? Meats and other protein foods: Beef. Pork. Lamb. Poultry with skin. Hot dogs. Tomasa Blase. ? Dairy: Ice cream. Sour cream. Whole milk. ? Beverages: Juice. Sugar-sweetened soft drinks. Beer. Liquor and spirits. ? Fats and oils: Butter. Canola oil. Vegetable oil. Beef fat (tallow). Lard. ? Sweets and desserts: Cookies. Cakes. Pies. Candy. ? Seasoning and other foods: Mayonnaise. Premade sauces and marinades. ? The items listed may not be a complete list. Talk with your dietitian about what dietary choices are right for you. Summary  The Mediterranean diet includes both food and lifestyle choices.  Eat a variety of fresh fruits and vegetables, beans, nuts, seeds, and whole grains.  Limit the amount of red meat and sweets that you eat.  Talk with your health care provider about whether it is safe for you to drink red wine in moderation. This means 1 glass a day for nonpregnant women and 2 glasses a day for men. A glass of wine equals 5 oz (150 mL). This information  is not intended to replace advice given to you by your health care provider. Make sure you discuss any questions you have with your health care provider. Document Released: 04/26/2016 Document Revised: 05/29/2016 Document Reviewed: 04/26/2016 Elsevier Interactive Patient Education  2019 [employer].   Coronavirus (COVID-19) Are you at risk?  Are you at risk for the Coronavirus (COVID-19)?  To be considered HIGH RISK for Coronavirus (COVID-19), you have to meet the following criteria:  . Traveled to [place], [place], [place], [place] or [place]; or in the [place] to [place], [place], [place], or [place]; and have fever, cough, and shortness of breath within the last 2 weeks of travel OR . Been in close contact with a person diagnosed with COVID-19 within the last 2 weeks and have fever, cough, and shortness of breath . IF YOU DO NOT MEET THESE CRITERIA, YOU ARE CONSIDERED LOW RISK FOR COVID-19.  What to do if you are HIGH RISK for COVID-19?  Marland Kitchen If you are having a medical emergency, call 911. . Seek medical care right away. Before you go to a doctor's office, urgent care or emergency department, call ahead and tell them about your recent travel, contact with someone diagnosed with COVID-19, and your  symptoms. You should receive instructions from your physician's office regarding next steps of care.  . When you arrive at healthcare provider, tell the healthcare staff immediately you have returned from visiting [place], [place], [place], [place] or [place]; or traveled in the [place] to [place], [place], [place], or [place]; in the last two weeks or you have been in close contact with a person diagnosed with COVID-19 in the last 2 weeks.   . Tell the health care staff about your symptoms: fever, cough and shortness of breath. . After you have been seen by a medical provider, you will be either: o Tested for (COVID-19) and discharged home on quarantine except to seek  medical care if symptoms worsen, and asked to  - Stay home and avoid contact with others until you get your results (4-5 days)  - Avoid travel on public transportation if possible (such as bus, train, or airplane) or o Sent to the Emergency Department by EMS for evaluation, COVID-19 testing, and possible admission depending on your condition and test results.  What to do if you are LOW RISK for COVID-19?  Reduce your risk of any infection by using the same precautions used for avoiding the common cold or flu:  Marland Kitchen Wash your hands often with soap and warm water for at least 20 seconds.  If soap and water are not readily available, use an alcohol-based hand sanitizer with at least 60% alcohol.  . If coughing or sneezing, cover your mouth and nose by coughing or sneezing into the elbow areas of your shirt or coat, into a tissue or into your sleeve (not your hands). . Avoid shaking hands with others and consider head nods or verbal greetings only. . Avoid touching your eyes, nose, or mouth with unwashed hands.  . Avoid close contact with people who are sick. . Avoid places or events with large numbers of people in one location, like concerts or sporting events. . Carefully consider travel plans you have or are making. . If you are planning any travel outside or inside the [place], visit the CDC's Travelers' Health webpage for the latest health notices. . If you have some symptoms but not all symptoms, continue to monitor at home and seek medical attention if your symptoms worsen. . If you are having a medical emergency, call 911.   ADDITIONAL HEALTHCARE OPTIONS FOR PATIENTS  [facility] / e-Visit: [email or web address]         [facility]: [phone]  [facility]: [phone]                   [facility]: [phone]   Please schedule annual follow-up appt in jan 2021 and bring paperwork from Dec  Employer physical. Continue to drink plenty of fluids and follow Mediterranean diet. When you need refill on medication, please call your pharmacy and they will contact us. WELCOME TO THE PRACTICE!

## 2018-12-08 NOTE — Assessment & Plan Note (Signed)
Please schedule annual follow-up appt in jan 2021 and bring paperwork from Dec Employer physical. Continue to drink plenty of fluids and follow Mediterranean diet. When you need refill on medication, please call your pharmacy and they will contact us.

## 2018-12-08 NOTE — Assessment & Plan Note (Signed)
HSV2- he denies ever experiencing a genital herpes breakout, reports being dx'd via blood test 3 years ago He takes Valrex 500mg  QD for suppressive therapy, denies medication SE He denies need for medication RF today

## 2018-12-23 DIAGNOSIS — D485 Neoplasm of uncertain behavior of skin: Secondary | ICD-10-CM | POA: Diagnosis not present

## 2018-12-23 DIAGNOSIS — L570 Actinic keratosis: Secondary | ICD-10-CM | POA: Diagnosis not present

## 2018-12-25 DIAGNOSIS — D0339 Melanoma in situ of other parts of face: Secondary | ICD-10-CM | POA: Diagnosis not present

## 2019-01-01 DIAGNOSIS — D0339 Melanoma in situ of other parts of face: Secondary | ICD-10-CM | POA: Diagnosis not present

## 2019-02-13 DIAGNOSIS — I10 Essential (primary) hypertension: Secondary | ICD-10-CM | POA: Diagnosis not present

## 2019-02-13 DIAGNOSIS — E7800 Pure hypercholesterolemia, unspecified: Secondary | ICD-10-CM | POA: Diagnosis not present

## 2019-02-17 DIAGNOSIS — Z1331 Encounter for screening for depression: Secondary | ICD-10-CM | POA: Diagnosis not present

## 2019-02-17 DIAGNOSIS — G4700 Insomnia, unspecified: Secondary | ICD-10-CM | POA: Diagnosis not present

## 2019-02-17 DIAGNOSIS — N529 Male erectile dysfunction, unspecified: Secondary | ICD-10-CM | POA: Diagnosis not present

## 2019-02-17 DIAGNOSIS — I10 Essential (primary) hypertension: Secondary | ICD-10-CM | POA: Diagnosis not present

## 2019-02-17 DIAGNOSIS — K219 Gastro-esophageal reflux disease without esophagitis: Secondary | ICD-10-CM | POA: Diagnosis not present

## 2019-03-03 ENCOUNTER — Other Ambulatory Visit: Payer: Self-pay

## 2019-07-17 ENCOUNTER — Other Ambulatory Visit: Payer: Self-pay

## 2019-07-17 ENCOUNTER — Ambulatory Visit: Payer: Self-pay

## 2019-07-17 DIAGNOSIS — Z23 Encounter for immunization: Secondary | ICD-10-CM

## 2019-10-12 ENCOUNTER — Ambulatory Visit: Payer: BLUE CROSS/BLUE SHIELD | Admitting: Adult Health

## 2019-11-06 DIAGNOSIS — Z03818 Encounter for observation for suspected exposure to other biological agents ruled out: Secondary | ICD-10-CM | POA: Diagnosis not present

## 2020-02-26 NOTE — Progress Notes (Signed)
Scheduled to complete physical 03/07/20 with Rhonda Summers, PA-C.  AMD 

## 2020-02-29 ENCOUNTER — Other Ambulatory Visit: Payer: Self-pay

## 2020-02-29 ENCOUNTER — Ambulatory Visit: Payer: Self-pay

## 2020-02-29 DIAGNOSIS — Z0000 Encounter for general adult medical examination without abnormal findings: Secondary | ICD-10-CM

## 2020-02-29 LAB — POCT URINALYSIS DIPSTICK
Bilirubin, UA: NEGATIVE
Blood, UA: NEGATIVE
Glucose, UA: NEGATIVE
Ketones, UA: NEGATIVE
Leukocytes, UA: NEGATIVE
Nitrite, UA: NEGATIVE
Protein, UA: NEGATIVE
Spec Grav, UA: >=1.03 — AB
Urobilinogen, UA: 0.2 U/dL
pH, UA: 6

## 2020-03-01 LAB — CMP12+LP+TP+TSH+6AC+CBC/D/PLT
ALT: 39 IU/L (ref 0–44)
AST: 21 IU/L (ref 0–40)
Albumin/Globulin Ratio: 2.3 — ABNORMAL HIGH (ref 1.2–2.2)
Albumin: 4.5 g/dL (ref 4.0–5.0)
Alkaline Phosphatase: 55 IU/L (ref 48–121)
BUN/Creatinine Ratio: 21 — ABNORMAL HIGH (ref 9–20)
BUN: 16 mg/dL (ref 6–20)
Basophils Absolute: 0 10*3/uL (ref 0.0–0.2)
Basos: 0 %
Bilirubin Total: 0.3 mg/dL (ref 0.0–1.2)
Calcium: 9.4 mg/dL (ref 8.7–10.2)
Chloride: 102 mmol/L (ref 96–106)
Chol/HDL Ratio: 6.8 ratio — ABNORMAL HIGH (ref 0.0–5.0)
Cholesterol, Total: 226 mg/dL — ABNORMAL HIGH (ref 100–199)
Creatinine, Ser: 0.77 mg/dL (ref 0.76–1.27)
EOS (ABSOLUTE): 0 10*3/uL (ref 0.0–0.4)
Eos: 1 %
Estimated CHD Risk: 1.4 times avg. — ABNORMAL HIGH (ref 0.0–1.0)
Free Thyroxine Index: 1.8 (ref 1.2–4.9)
GFR calc Af Amer: 136 mL/min/{1.73_m2} (ref >59)
GFR calc non Af Amer: 118 mL/min/{1.73_m2} (ref >59)
GGT: 31 IU/L (ref 0–65)
Globulin, Total: 2 g/dL (ref 1.5–4.5)
Glucose: 97 mg/dL (ref 65–99)
HDL: 33 mg/dL — ABNORMAL LOW (ref >39)
Hematocrit: 42.5 % (ref 37.5–51.0)
Hemoglobin: 14.9 g/dL (ref 13.0–17.7)
Immature Grans (Abs): 0 10*3/uL (ref 0.0–0.1)
Immature Granulocytes: 0 %
Iron: 66 ug/dL (ref 38–169)
LDH: 145 IU/L (ref 121–224)
LDL Chol Calc (NIH): 155 mg/dL — ABNORMAL HIGH (ref 0–99)
Lymphocytes Absolute: 1.6 10*3/uL (ref 0.7–3.1)
Lymphs: 33 %
MCH: 29.8 pg (ref 26.6–33.0)
MCHC: 35.1 g/dL (ref 31.5–35.7)
MCV: 85 fL (ref 79–97)
Monocytes Absolute: 0.4 10*3/uL (ref 0.1–0.9)
Monocytes: 9 %
Neutrophils Absolute: 2.7 10*3/uL (ref 1.4–7.0)
Neutrophils: 57 %
Phosphorus: 3.4 mg/dL (ref 2.8–4.1)
Platelets: 197 10*3/uL (ref 150–450)
Potassium: 4.1 mmol/L (ref 3.5–5.2)
RBC: 5 x10E6/uL (ref 4.14–5.80)
RDW: 12.6 % (ref 11.6–15.4)
Sodium: 141 mmol/L (ref 134–144)
T3 Uptake Ratio: 26 % (ref 24–39)
T4, Total: 7 ug/dL (ref 4.5–12.0)
TSH: 2.33 u[IU]/mL (ref 0.450–4.500)
Total Protein: 6.5 g/dL (ref 6.0–8.5)
Triglycerides: 205 mg/dL — ABNORMAL HIGH (ref 0–149)
Uric Acid: 5.4 mg/dL (ref 3.8–8.4)
VLDL Cholesterol Cal: 38 mg/dL (ref 5–40)
WBC: 4.8 10*3/uL (ref 3.4–10.8)

## 2020-03-07 ENCOUNTER — Ambulatory Visit: Payer: Self-pay | Admitting: Emergency Medicine

## 2020-03-07 ENCOUNTER — Other Ambulatory Visit: Payer: Self-pay

## 2020-03-07 VITALS — BP 111/78 | HR 82 | Temp 96.9°F | Resp 12 | Ht 78.0 in | Wt 290.0 lb

## 2020-03-07 DIAGNOSIS — Z0000 Encounter for general adult medical examination without abnormal findings: Secondary | ICD-10-CM

## 2020-03-07 MED ORDER — VALACYCLOVIR HCL 500 MG PO TABS: 500.0000 mg | ORAL_TABLET | Freq: Every day | ORAL | 11 refills | Status: DC

## 2020-03-07 NOTE — Progress Notes (Signed)
Needs Rx refill of Valcyclovir.

## 2020-03-07 NOTE — Progress Notes (Signed)
   I have reviewed the triage vital signs and the nursing notes.   HISTORY  Chief Complaint Annual Exam   HPI David Luna is a 34 y.o. male presents for annual physical.        Past Medical History:  Diagnosis Date  . Asthma    As a small child. Pt reports he outgrew.   . Elevated ALT measurement   . Elevated lipids   . HSV-2 seropositive   . IBS (irritable bowel syndrome)   . Increased BMI     Patient Active Problem List   Diagnosis Date Noted  . Healthcare maintenance 12/08/2018  . BMI 33.0-33.9,adult 12/08/2018  . Herpes simplex type 2 infection 04/27/2015    Past Surgical History:  Procedure Laterality Date  . WISDOM TOOTH EXTRACTION  34 years old    Prior to Admission medications   Medication Sig Start Date End Date Taking? Authorizing Provider  valACYclovir (VALTREX) 500 MG tablet Take 1 tablet (500 mg total) by mouth daily. 03/07/20   Tommi Rumps, PA-C    Allergies Patient has no known allergies.  Family History  Problem Relation Age of Onset  . Hypertension Mother   . Hypertension Father   . Diabetes Maternal Grandfather     Social History Social History   Tobacco Use  . Smoking status: Never Smoker  . Smokeless tobacco: Never Used  Vaping Use  . Vaping Use: Never used  Substance Use Topics  . Alcohol use: Yes    Alcohol/week: 5.0 standard drinks    Types: 5 Cans of beer per week  . Drug use: No    Review of Systems Constitutional: No fever/chills Eyes: No visual changes. ENT: No sore throat. Cardiovascular: Denies chest pain. Respiratory: Denies shortness of breath. Gastrointestinal: No abdominal pain.  No nausea, no vomiting.  Musculoskeletal: Negative for joint or muscle pain. Skin: Negative for rash.  Physical exam  Constitutional: Alert and oriented. Well appearing and in no acute distress. Eyes: Conjunctivae are normal. PERRL. EOMI. Head: Atraumatic. Neck: No stridor.  No cervical tenderness on palpation  posteriorly. Cardiovascular: Normal rate, regular rhythm. Grossly normal heart sounds.  Good peripheral circulation. Respiratory: Normal respiratory effort.  No retractions. Lungs CTAB. Gastrointestinal: Soft and nontender. No distention.  Bowel sounds are normoactive x4 quadrants. Musculoskeletal: Nontender thoracic or lumbar spine palpation.  Good muscle strength bilaterally.  Normal gait was noted. Neurologic:  Normal speech and language. No gross focal neurologic deficits are appreciated. No gait instability. Skin:  Skin is warm, dry and intact. No rash noted. Psychiatric: Mood and affect are normal. Speech and behavior are normal.  ____________________________________________   LABS (all labs ordered are listed, but only abnormal results are displayed)  Briefly reviewed with patient however he is already like those lab results on my chart. ____________________________________________  EKG   ____________________________________________   FINAL CLINICAL IMPRESSION(S) / ED DIAGNOSES  34 year old Well physical exam for male. Medication was refilled as requested.   ED Discharge Orders         Ordered    valACYclovir (VALTREX) 500 MG tablet  Daily     Discontinue  Reprint     03/07/20 0937           Note:  This document was prepared using Dragon voice recognition software and may include unintentional dictation errors.

## 2020-06-18 DIAGNOSIS — J0110 Acute frontal sinusitis, unspecified: Secondary | ICD-10-CM | POA: Diagnosis not present

## 2020-06-18 DIAGNOSIS — Z23 Encounter for immunization: Secondary | ICD-10-CM | POA: Diagnosis not present

## 2020-06-18 DIAGNOSIS — H66002 Acute suppurative otitis media without spontaneous rupture of ear drum, left ear: Secondary | ICD-10-CM | POA: Diagnosis not present

## 2020-07-07 DIAGNOSIS — Z03818 Encounter for observation for suspected exposure to other biological agents ruled out: Secondary | ICD-10-CM | POA: Diagnosis not present

## 2020-07-07 DIAGNOSIS — J329 Chronic sinusitis, unspecified: Secondary | ICD-10-CM | POA: Diagnosis not present

## 2020-07-07 DIAGNOSIS — H66002 Acute suppurative otitis media without spontaneous rupture of ear drum, left ear: Secondary | ICD-10-CM | POA: Diagnosis not present

## 2020-08-19 DIAGNOSIS — Z03818 Encounter for observation for suspected exposure to other biological agents ruled out: Secondary | ICD-10-CM | POA: Diagnosis not present

## 2020-09-08 DIAGNOSIS — Z7189 Other specified counseling: Secondary | ICD-10-CM | POA: Diagnosis not present

## 2020-09-08 DIAGNOSIS — U071 COVID-19: Secondary | ICD-10-CM | POA: Diagnosis not present

## 2020-10-05 DIAGNOSIS — D225 Melanocytic nevi of trunk: Secondary | ICD-10-CM | POA: Diagnosis not present

## 2020-10-05 DIAGNOSIS — L814 Other melanin hyperpigmentation: Secondary | ICD-10-CM | POA: Diagnosis not present

## 2020-10-05 DIAGNOSIS — L249 Irritant contact dermatitis, unspecified cause: Secondary | ICD-10-CM | POA: Diagnosis not present

## 2020-10-05 DIAGNOSIS — L821 Other seborrheic keratosis: Secondary | ICD-10-CM | POA: Diagnosis not present

## 2021-02-14 NOTE — Progress Notes (Signed)
Scheduled to complete physical 02/22/21 with Ron Smith, PA-C.  AMD 

## 2021-02-15 ENCOUNTER — Ambulatory Visit: Payer: Self-pay

## 2021-02-15 ENCOUNTER — Other Ambulatory Visit: Payer: Self-pay

## 2021-02-15 DIAGNOSIS — Z0000 Encounter for general adult medical examination without abnormal findings: Secondary | ICD-10-CM

## 2021-02-15 LAB — POCT URINALYSIS DIPSTICK
Bilirubin, UA: NEGATIVE
Blood, UA: NEGATIVE
Glucose, UA: NEGATIVE
Ketones, UA: NEGATIVE
Leukocytes, UA: NEGATIVE
Nitrite, UA: NEGATIVE
Protein, UA: NEGATIVE
Spec Grav, UA: 1.01 (ref 1.010–1.025)
Urobilinogen, UA: 0.2 E.U./dL
pH, UA: 6 (ref 5.0–8.0)

## 2021-02-16 LAB — CMP12+LP+TP+TSH+6AC+CBC/D/PLT
ALT: 34 IU/L (ref 0–44)
AST: 21 IU/L (ref 0–40)
Albumin/Globulin Ratio: 2.3 — ABNORMAL HIGH (ref 1.2–2.2)
Albumin: 4.6 g/dL (ref 4.0–5.0)
Alkaline Phosphatase: 55 IU/L (ref 44–121)
BUN/Creatinine Ratio: 17 (ref 9–20)
BUN: 12 mg/dL (ref 6–20)
Basophils Absolute: 0 10*3/uL (ref 0.0–0.2)
Basos: 0 %
Bilirubin Total: 0.3 mg/dL (ref 0.0–1.2)
Calcium: 9.5 mg/dL (ref 8.7–10.2)
Chloride: 102 mmol/L (ref 96–106)
Chol/HDL Ratio: 5.6 ratio — ABNORMAL HIGH (ref 0.0–5.0)
Cholesterol, Total: 223 mg/dL — ABNORMAL HIGH (ref 100–199)
Creatinine, Ser: 0.69 mg/dL — ABNORMAL LOW (ref 0.76–1.27)
EOS (ABSOLUTE): 0 10*3/uL (ref 0.0–0.4)
Eos: 0 %
Estimated CHD Risk: 1.2 times avg. — ABNORMAL HIGH (ref 0.0–1.0)
Free Thyroxine Index: 1.8 (ref 1.2–4.9)
GGT: 23 IU/L (ref 0–65)
Globulin, Total: 2 g/dL (ref 1.5–4.5)
Glucose: 101 mg/dL — ABNORMAL HIGH (ref 65–99)
HDL: 40 mg/dL (ref >39)
Hematocrit: 42.9 % (ref 37.5–51.0)
Hemoglobin: 14.9 g/dL (ref 13.0–17.7)
Immature Grans (Abs): 0 10*3/uL (ref 0.0–0.1)
Immature Granulocytes: 0 %
Iron: 66 ug/dL (ref 38–169)
LDH: 157 IU/L (ref 121–224)
LDL Chol Calc (NIH): 153 mg/dL — ABNORMAL HIGH (ref 0–99)
Lymphocytes Absolute: 1.7 10*3/uL (ref 0.7–3.1)
Lymphs: 34 %
MCH: 29.7 pg (ref 26.6–33.0)
MCHC: 34.7 g/dL (ref 31.5–35.7)
MCV: 86 fL (ref 79–97)
Monocytes Absolute: 0.4 10*3/uL (ref 0.1–0.9)
Monocytes: 8 %
Neutrophils Absolute: 2.9 10*3/uL (ref 1.4–7.0)
Neutrophils: 58 %
Phosphorus: 3.1 mg/dL (ref 2.8–4.1)
Platelets: 183 10*3/uL (ref 150–450)
Potassium: 4.1 mmol/L (ref 3.5–5.2)
RBC: 5.01 x10E6/uL (ref 4.14–5.80)
RDW: 12.6 % (ref 11.6–15.4)
Sodium: 139 mmol/L (ref 134–144)
T3 Uptake Ratio: 23 % — ABNORMAL LOW (ref 24–39)
T4, Total: 7.7 ug/dL (ref 4.5–12.0)
TSH: 1.48 u[IU]/mL (ref 0.450–4.500)
Total Protein: 6.6 g/dL (ref 6.0–8.5)
Triglycerides: 162 mg/dL — ABNORMAL HIGH (ref 0–149)
Uric Acid: 4.7 mg/dL (ref 3.8–8.4)
VLDL Cholesterol Cal: 30 mg/dL (ref 5–40)
WBC: 5 10*3/uL (ref 3.4–10.8)
eGFR: 123 mL/min/{1.73_m2} (ref >59)

## 2021-02-22 ENCOUNTER — Encounter: Payer: Self-pay | Admitting: Physician Assistant

## 2021-02-22 ENCOUNTER — Ambulatory Visit: Payer: Self-pay | Admitting: Physician Assistant

## 2021-02-22 ENCOUNTER — Other Ambulatory Visit: Payer: Self-pay

## 2021-02-22 VITALS — BP 120/75 | HR 82 | Temp 96.9°F | Resp 12 | Ht 78.0 in | Wt 288.0 lb

## 2021-02-22 DIAGNOSIS — Z0000 Encounter for general adult medical examination without abnormal findings: Secondary | ICD-10-CM

## 2021-02-22 NOTE — Progress Notes (Signed)
   Subjective: Annual physical exam    Patient ID: David Luna, male    DOB: 07/05/1986, 35 y.o.   MRN: [id number]  HPI  Patient presents annual physical exam.  Patient reports no concerns or complaints.  Review of Systems Negative    Objective:   Physical Exam No acute distress.  Temperature is 96.9, pulse 82, respiration 12, BP is 120/75, patient 97% O2 sat on room air.  Patient weighs 29 pounds. HEENT is unremarkable.  Neck is supple adenectomy or bruits.  Lungs are clear to auscultation.  Heart regular rate and rhythm. Abdomen with negative HSM, normoactive bowel sounds, soft, and nontender to palpation. No obvious deformity to the upper or lower extremities.  Patient has full and equal range of motion upper and lower extremities. No cervical or lumbar spine deformity.  Patient has full and equal range of motion of the cervical lumbar spine. Cranial nerves II through XII grossly intact.  DTR's 2+ without clonus.       Assessment & Plan: Well exam.  Discussed lab results with patient.  Patient continue to use lifestyle modification to decrease his lipid profile.  Follow-up as necessary.

## 2021-04-03 ENCOUNTER — Encounter: Payer: Self-pay | Admitting: Physician Assistant

## 2021-04-03 ENCOUNTER — Ambulatory Visit: Payer: Self-pay | Admitting: Physician Assistant

## 2021-04-03 VITALS — BP 119/83 | HR 80 | Temp 97.7°F | Resp 14 | Ht 78.0 in | Wt 285.0 lb

## 2021-04-03 DIAGNOSIS — Z1152 Encounter for screening for COVID-19: Secondary | ICD-10-CM

## 2021-04-03 LAB — POC COVID19 BINAXNOW: SARS Coronavirus 2 Ag: NEGATIVE

## 2021-04-03 MED ORDER — FEXOFENADINE-PSEUDOEPHED ER 60-120 MG PO TB12: 1.0000 | ORAL_TABLET | Freq: Two times a day (BID) | ORAL | 0 refills | Status: DC

## 2021-04-03 MED ORDER — AMOXICILLIN 875 MG PO TABS: 875.0000 mg | ORAL_TABLET | Freq: Two times a day (BID) | ORAL | 0 refills | Status: AC

## 2021-04-03 NOTE — Progress Notes (Signed)
   Subjective: Sinus congestion    Patient ID: David Luna, male    DOB: [birth year], 35 y.o.   MRN: [id number]  HPI Patient complaining of 1 week of left maxillary sinus congestion.  Patient stated that he started developing dental pain to the left upper molars.  Patient has no history of dental issues.  Denies fever associated complaint.  States developed frontal headache this morning.  Rates pain as a 5/10.  Described pain as "pressure".  No palliative measure for complaint.   Review of Systems     Objective:   Physical Exam No acute distress.  Temperature is 97.7, pulse 80, respiration 14, BP is 119/83, patient 97% O2 sat on room air.  Patient weighs 285 pounds and BMI is 32.9. HEENT is remarkable edematous nasal turbinates and left maxillary guarding.  Neck is supple for active adenopathy or bruits.  Lungs clear to auscultation.  Heart regular rate and rhythm.        Assessment & Plan: Subacute left maxillary sinusitis.   Patient given a prescription for amoxicillin and Allegra-D.  Patient advised to follow-up in 1 week if no improvement or worsening complaint.

## 2021-04-03 NOTE — Progress Notes (Signed)
Tooth pain on left side started 04/01/21 Saturday, and now pressure has moved to left side of face right under left eye. Pt has the same pain and symptoms from last summer and went to the dentist and they told him no signs of damage or infection. Xray was done as well and it showed fluid in sinus and he needed to be checked for sinus infection. Which brings pt in today having same symptoms. CL,RMA

## 2021-04-12 ENCOUNTER — Other Ambulatory Visit: Payer: Self-pay

## 2021-04-12 DIAGNOSIS — B009 Herpesviral infection, unspecified: Secondary | ICD-10-CM

## 2021-04-13 MED ORDER — VALACYCLOVIR HCL 500 MG PO TABS: 500.0000 mg | ORAL_TABLET | Freq: Every day | ORAL | 11 refills | Status: DC

## 2021-07-14 ENCOUNTER — Other Ambulatory Visit: Payer: Self-pay

## 2021-07-14 DIAGNOSIS — Z0283 Encounter for blood-alcohol and blood-drug test: Secondary | ICD-10-CM

## 2021-07-14 NOTE — Progress Notes (Signed)
Presents to [facility] for Post-accident Drug Screen.  [facility] Acct #:  [id number] [facility] Specimen #:  [id number]  AMD

## 2021-08-15 ENCOUNTER — Other Ambulatory Visit: Payer: Self-pay

## 2021-08-15 ENCOUNTER — Encounter: Payer: Self-pay | Admitting: Physician Assistant

## 2021-08-15 ENCOUNTER — Ambulatory Visit: Payer: Self-pay | Admitting: Physician Assistant

## 2021-08-15 VITALS — BP 122/79 | HR 81 | Temp 97.6°F | Resp 14 | Ht 78.0 in | Wt 290.0 lb

## 2021-08-15 DIAGNOSIS — L821 Other seborrheic keratosis: Secondary | ICD-10-CM | POA: Insufficient documentation

## 2021-08-15 DIAGNOSIS — L723 Sebaceous cyst: Secondary | ICD-10-CM | POA: Insufficient documentation

## 2021-08-15 DIAGNOSIS — R051 Acute cough: Secondary | ICD-10-CM

## 2021-08-15 MED ORDER — HYDROCOD POLST-CPM POLST ER 10-8 MG/5ML PO SUER: 5.0000 mL | Freq: Every evening | ORAL | 0 refills | Status: DC | PRN

## 2021-08-15 MED ORDER — BENZONATATE 200 MG PO CAPS: 200.0000 mg | ORAL_CAPSULE | Freq: Two times a day (BID) | ORAL | 0 refills | Status: DC | PRN

## 2021-08-15 NOTE — Progress Notes (Signed)
Pt states he's been having a cough for about three weeks possible mucous draining but not enough to bother him. Pt denies having any seasonal allergies.Pt only coughs up "phlegm" David Luna

## 2021-08-15 NOTE — Progress Notes (Signed)
   Subjective:cough    Patient ID: David Luna, male    DOB: 08/21/1986, 35 y.o.   MRN: [id number]  HPI Patient complain of intermittent productive and nonproductive cough for 3 weeks.  Denies any other URI signs and symptoms.  Tested negative for COVID with home kit.   Review of Systems Negative except for complaint    Objective:   Physical Exam No acute distress.  Temperature 97.6, pulse 81, respiration 14, BP is 122/79, patient 96% O2 sat on room air.  Patient weighs 290 pounds and BMI is 23.5  HEENT was unremarkable.  Neck is supple without inflammatory or bruits.  Lungs are clear to auscultation.  Heart is regular rate and rhythm.      Assessment & Plan: Acute cough   Patient given a prescription for Tessalon Perles to take every 8 hours during the day.  Patient given Tussionex elixir to take at night before sleeping.  Advised to follow-up if no improvement in 3 to 5 days.

## 2021-08-17 ENCOUNTER — Other Ambulatory Visit: Payer: Self-pay

## 2021-08-17 MED ORDER — HYDROCOD POLST-CPM POLST ER 10-8 MG/5ML PO SUER: 5.0000 mL | Freq: Every evening | ORAL | 0 refills | Status: DC | PRN

## 2021-11-24 ENCOUNTER — Other Ambulatory Visit: Payer: Self-pay

## 2021-11-24 DIAGNOSIS — Z0000 Encounter for general adult medical examination without abnormal findings: Secondary | ICD-10-CM

## 2021-11-24 LAB — POCT URINALYSIS DIPSTICK
Bilirubin, UA: NEGATIVE
Blood, UA: NEGATIVE
Glucose, UA: NEGATIVE
Ketones, UA: NEGATIVE
Leukocytes, UA: NEGATIVE
Nitrite, UA: NEGATIVE
Protein, UA: NEGATIVE
Spec Grav, UA: 1.02 (ref 1.010–1.025)
Urobilinogen, UA: 0.2 E.U./dL
pH, UA: 6 (ref 5.0–8.0)

## 2021-11-24 NOTE — Progress Notes (Signed)
11/28/21 annual physical scheduled. ?

## 2021-11-25 LAB — CMP12+LP+TP+TSH+6AC+CBC/D/PLT
ALT: 48 IU/L — ABNORMAL HIGH (ref 0–44)
AST: 26 IU/L (ref 0–40)
Albumin/Globulin Ratio: 2.3 — ABNORMAL HIGH (ref 1.2–2.2)
Albumin: 4.8 g/dL (ref 4.0–5.0)
Alkaline Phosphatase: 61 IU/L (ref 44–121)
BUN/Creatinine Ratio: 15 (ref 9–20)
BUN: 11 mg/dL (ref 6–20)
Basophils Absolute: 0 10*3/uL (ref 0.0–0.2)
Basos: 1 %
Bilirubin Total: 0.3 mg/dL (ref 0.0–1.2)
Calcium: 9.7 mg/dL (ref 8.7–10.2)
Chloride: 101 mmol/L (ref 96–106)
Chol/HDL Ratio: 5.9 ratio — ABNORMAL HIGH (ref 0.0–5.0)
Cholesterol, Total: 246 mg/dL — ABNORMAL HIGH (ref 100–199)
Creatinine, Ser: 0.73 mg/dL — ABNORMAL LOW (ref 0.76–1.27)
EOS (ABSOLUTE): 0 10*3/uL (ref 0.0–0.4)
Eos: 1 %
Estimated CHD Risk: 1.2 times avg. — ABNORMAL HIGH (ref 0.0–1.0)
Free Thyroxine Index: 1.8 (ref 1.2–4.9)
GGT: 35 IU/L (ref 0–65)
Globulin, Total: 2.1 g/dL (ref 1.5–4.5)
Glucose: 100 mg/dL — ABNORMAL HIGH (ref 70–99)
HDL: 42 mg/dL (ref >39)
Hematocrit: 43.9 % (ref 37.5–51.0)
Hemoglobin: 15.3 g/dL (ref 13.0–17.7)
Immature Grans (Abs): 0 10*3/uL (ref 0.0–0.1)
Immature Granulocytes: 0 %
Iron: 77 ug/dL (ref 38–169)
LDH: 153 IU/L (ref 121–224)
LDL Chol Calc (NIH): 160 mg/dL — ABNORMAL HIGH (ref 0–99)
Lymphocytes Absolute: 2.2 10*3/uL (ref 0.7–3.1)
Lymphs: 35 %
MCH: 29.4 pg (ref 26.6–33.0)
MCHC: 34.9 g/dL (ref 31.5–35.7)
MCV: 84 fL (ref 79–97)
Monocytes Absolute: 0.5 10*3/uL (ref 0.1–0.9)
Monocytes: 8 %
Neutrophils Absolute: 3.4 10*3/uL (ref 1.4–7.0)
Neutrophils: 55 %
Phosphorus: 3.6 mg/dL (ref 2.8–4.1)
Platelets: 193 10*3/uL (ref 150–450)
Potassium: 4.4 mmol/L (ref 3.5–5.2)
RBC: 5.21 x10E6/uL (ref 4.14–5.80)
RDW: 12.3 % (ref 11.6–15.4)
Sodium: 139 mmol/L (ref 134–144)
T3 Uptake Ratio: 22 % — ABNORMAL LOW (ref 24–39)
T4, Total: 8.1 ug/dL (ref 4.5–12.0)
TSH: 1.46 u[IU]/mL (ref 0.450–4.500)
Total Protein: 6.9 g/dL (ref 6.0–8.5)
Triglycerides: 239 mg/dL — ABNORMAL HIGH (ref 0–149)
Uric Acid: 5.1 mg/dL (ref 3.8–8.4)
VLDL Cholesterol Cal: 44 mg/dL — ABNORMAL HIGH (ref 5–40)
WBC: 6.2 10*3/uL (ref 3.4–10.8)
eGFR: 120 mL/min/{1.73_m2} (ref >59)

## 2021-11-28 ENCOUNTER — Encounter: Payer: Self-pay | Admitting: Physician Assistant

## 2021-11-28 ENCOUNTER — Other Ambulatory Visit: Payer: Self-pay

## 2021-11-28 ENCOUNTER — Ambulatory Visit: Payer: Self-pay | Admitting: Physician Assistant

## 2021-11-28 VITALS — BP 130/82 | HR 84 | Temp 97.7°F | Resp 14 | Ht 78.0 in | Wt 290.0 lb

## 2021-11-28 DIAGNOSIS — Z0000 Encounter for general adult medical examination without abnormal findings: Secondary | ICD-10-CM

## 2021-11-28 DIAGNOSIS — E782 Mixed hyperlipidemia: Secondary | ICD-10-CM

## 2021-11-28 MED ORDER — ROSUVASTATIN CALCIUM 40 MG PO TABS: 40.0000 mg | ORAL_TABLET | Freq: Every day | ORAL | 3 refills | Status: DC

## 2021-11-28 NOTE — Progress Notes (Signed)
Pt denies any issues or concerns at this time/CL,RMA ?

## 2021-11-28 NOTE — Progress Notes (Signed)
? ?[facility] ? ?____________________________________________ ? ? None  ?  (approximate) ? ?I have reviewed the triage vital signs and the nursing notes. ? ? ?HISTORY ? ?Chief Complaint ?Annual Exam ? ? ?HPI ?David Luna is a 36 y.o. male patient presents for annual physical exam.  Patient voices no concerns or complaints.  History of hyperlipidemia but no treatment. ?   ? ?  ? ? ?Past Medical History:  ?Diagnosis Date  ? Asthma   ? As a small child. Pt reports he outgrew.   ? Elevated ALT measurement   ? Elevated lipids   ? HSV-2 seropositive   ? IBS (irritable bowel syndrome)   ? Increased BMI   ? ? ?Patient Active Problem List  ? Diagnosis Date Noted  ? Other seborrheic keratosis 08/15/2021  ? Sebaceous cyst 08/15/2021  ? Healthcare maintenance 12/08/2018  ? BMI 33.0-33.9,adult 12/08/2018  ? Herpes simplex type 2 infection 04/27/2015  ? ? ?Past Surgical History:  ?Procedure Laterality Date  ? WISDOM TOOTH EXTRACTION  36 years old  ? ? ?Prior to Admission medications   ?Medication Sig Start Date End Date Taking? Authorizing Provider  ?rosuvastatin (CRESTOR) 40 MG tablet Take 1 tablet (40 mg total) by mouth daily. 11/28/21  Yes Joni Reining, PA-C  ?valACYclovir (VALTREX) 500 MG tablet Take 1 tablet (500 mg total) by mouth daily. 04/13/21  Yes Joni Reining, PA-C  ? ? ?Allergies ?Patient has no known allergies. ? ?Family History  ?Problem Relation Age of Onset  ? Hypertension Mother   ? Hypertension Father   ? Diabetes Maternal Grandfather   ? ? ?Social History ?Social History  ? ?Tobacco Use  ? Smoking status: Never  ? Smokeless tobacco: Never  ?Vaping Use  ? Vaping Use: Never used  ?Substance Use Topics  ? Alcohol use: Yes  ?  Alcohol/week: 5.0 standard drinks  ?  Types: 5 Cans of beer per week  ? Drug use: No  ? ? ?Review of Systems ?Constitutional: No fever/chills ?Eyes: No visual changes. ?ENT: No sore throat. ?Cardiovascular: Denies chest pain. ?Respiratory: Denies shortness of  breath. ?Gastrointestinal: No abdominal pain.  No nausea, no vomiting.  No diarrhea.  No constipation. ?Genitourinary: Negative for dysuria. ?Musculoskeletal: Negative for back pain. ?Skin: Negative for rash. ?Neurological: Negative for headaches, focal weakness or numbness. ?Endocrine: Hyperlipidemia ? ?____________________________________________ ? ? ?PHYSICAL EXAM: ? ?VITAL SIGNS: Temperature is 97.7, pulse 84, respiration 14, BP is 130/82, and patient 96% O2 sat on room air.  Patient weighs 290 pounds and BMI is 33.51. ?Constitutional: Alert and oriented. Well appearing and in no acute distress. ?Eyes: Conjunctivae are normal. PERRL. EOMI. ?Head: Atraumatic. ?Nose: No congestion/rhinnorhea. ?Mouth/Throat: Mucous membranes are moist.  Oropharynx non-erythematous. ?Neck: No stridor.  No cervical spine tenderness to palpation. ?Hematological/Lymphatic/Immunilogical: No cervical lymphadenopathy. ?Cardiovascular: Normal rate, regular rhythm. Grossly normal heart sounds.  Good peripheral circulation. ?Respiratory: Normal respiratory effort.  No retractions. Lungs CTAB. ?Gastrointestinal: Soft and nontender. No distention. No abdominal bruits. No CVA tenderness. ?Genitourinary: Deferred ?Musculoskeletal: No lower extremity tenderness nor edema.  No joint effusions. ?Neurologic:  Normal speech and language. No gross focal neurologic deficits are appreciated. No gait instability. ?Skin:  Skin is warm, dry and intact. No rash noted. ?Psychiatric: Mood and affect are normal. Speech and behavior are normal. ? ?____________________________________________ ?  ?LABS ? ?__ ?      ?Component Ref Range & Units 4 d ago 9 mo ago 1 yr ago  ?Color, UA  yellow  yellow  Dark Yellow   ?Clarity, UA  clear  clear  Clear   ?Glucose, UA Negative Negative  Negative  Negative   ?Bilirubin, UA  negative  negative  Negative   ?Ketones, UA  negative  negative  Negative   ?Spec Grav, UA 1.010 - 1.025 1.020  1.010  >=1.030 Abnormal    ?Blood, UA   negative  negative  Negative   ?pH, UA 5.0 - 8.0 6.0  6.0  6.0   ?Protein, UA Negative Negative  Negative  Negative   ?Urobilinogen, UA 0.2 or 1.0 E.U./dL 0.2  0.2  0.2   ?Nitrite, UA  negative  negative  Negative   ?Leukocytes, UA Negative Negative  Negative  Negative   ?Appearance   light     ?Odor        ?  ? ?  ?  ?Specimen Collected: 11/24/21 09:56 Last Resulted: 11/24/21 09:56  ?  ?  Lab Flowsheet   ? Order Details   ? View Encounter   ? Lab and Collection Details   ? Routing   ? Result History    ?View Encounter Conversation    ?  ?  ?Result Care Coordination ? ? ?Patient Communication ? ? Add Comments   Add Notifications  Back to Top  ?  ?  ? ?Other Results from 11/24/2021 ? ? Contains abnormal data CMP12+LP+TP+TSH+6AC+CBC/D/Plt ?Order: [id number] ?Status: Final result    ?Visible to patient: Yes (seen)    ?Next appt: None    ?Dx: Routine adult health maintenance    ?0 Result Notes ?          ?Component Ref Range & Units 4 d ago ?(11/24/21) 9 mo ago ?(02/15/21) 1 yr ago ?(02/29/20) 3 yr ago ?(09/02/18) 3 yr ago ?(09/02/18) 3 yr ago ?(02/27/18) 3 yr ago ?(02/27/18)  ?Glucose 70 - 99 mg/dL 100 High   101 High  R  97 R       ?Uric Acid 3.8 - 8.4 mg/dL 5.1  4.7 CM  5.4 CM       ?Comment:            Therapeutic target for gout patients: <6.0  ?BUN 6 - 20 mg/dL $Remove'11  12  16       'WbSiiYQ$ ?Creatinine, Ser 0.76 - 1.27 mg/dL 0.73 Low   0.69 Low   0.77       ?eGFR >59 mL/min/1.73 120  123        ?BUN/Creatinine Ratio 9 - $R'20 15  17  21 'Xo$ High        ?Sodium 134 - 144 mmol/L 139  139  141       ?Potassium 3.5 - 5.2 mmol/L 4.4  4.1  4.1       ?Chloride 96 - 106 mmol/L 101  102  102       ?Calcium 8.7 - 10.2 mg/dL 9.7  9.5  9.4       ?Phosphorus 2.8 - 4.1 mg/dL 3.6  3.1  3.4       ?Total Protein 6.0 - 8.5 g/dL 6.9  6.6  6.5       ?Albumin 4.0 - 5.0 g/dL 4.8  4.6  4.5       ?Globulin, Total 1.5 - 4.5 g/dL 2.1  2.0  2.0       ?Albumin/Globulin Ratio 1.2 - 2.2 2.3 High   2.3 High   2.3 High        ?Bilirubin Total  0.0 - 1.2 mg/dL 0.3  0.3  0.3        ?Alkaline Phosphatase 44 - 121 IU/L 61  55  55 R       ?LDH 121 - 224 IU/L 153  157  145       ?AST 0 - 40 IU/L $Remov'26  21  21  25 'EhIYHU$ R   22 R    ?ALT 0 - 44 IU/L 48 High   34  39  53 Abnormal  R   45 Abnormal  R    ?GGT 0 - 65 IU/L 35  23  31       ?Iron 38 - 169 ug/dL 77  66  66       ?Cholesterol, Total 100 - 199 mg/dL 246 High   223 High   226 High        ?Triglycerides 0 - 149 mg/dL 239 High   162 High   205 High    103 R   170 Abnormal  R   ?HDL >39 mg/dL 42  40  33 Low    37 R   44 R   ?VLDL Cholesterol Cal 5 - 40 mg/dL 44 High   30  38       ?LDL Chol Calc (NIH) 0 - 99 mg/dL 160 High   153 High   155 High        ?Chol/HDL Ratio 0.0 - 5.0 ratio 5.9 High   5.6 High  CM  6.8 High  CM       ?Comment:                                   T. Chol/HDL Ratio  ?                                            Men  Women  ?                              1/2 Avg.Risk  3.4    3.3  ?                                  Avg.Risk  5.0    4.4  ?                               2X Avg.Risk  9.6    7.1  ?                               3X Avg.Risk 23.4   11.0   ?Estimated CHD Risk 0.0 - 1.0 times avg. 1.2 High   1.2 High  CM  1.4 High  CM       ?Comment: The CHD Risk is based on the T. Chol/HDL ratio. Other  ?factors affect CHD Risk such as hypertension, smoking,  ?diabetes, severe obesity, and family history of  ?premature CHD.   ?TSH 0.450 - 4.500 uIU/mL 1.460  1.480  2.330       ?T4, Total 4.5 - 12.0 ug/dL 8.1  7.7  7.0       ?T3 Uptake Ratio 24 - 39 % 22 Low   23 Low   26       ?Free Thyroxine Index 1.2 - 4.9 1.8  1.8  1.8       ?WBC 3.4 - 10.8 x10E3/uL 6.2  5.0  4.8       ?RBC 4.14 - 5.80 x10E6/uL 5.21  5.01  5.00       ?Hemoglobin 13.0 - 17.7 g/dL 15.3  14.9  14.9       ?Hematocrit 37.5 - 51.0 % 43.9  42.9  42.5       ?MCV 79 - 97 fL 84  86  85       ?MCH 26.6 - 33.0 pg 29.4  29.7  29.8       ?MCHC 31.5 - 35.7 g/dL 34.9  34.7  35.1       ?RDW 11.6 - 15.4 % 12.3  12.6  12.6       ?Platelets 150 - 450 x10E3/uL 193  183  197        ?Neutrophils Not Estab. % 55  58  57       ?Lymphs Not Estab. % 35  34  33       ?Monocytes Not Estab. % $Remove'8  8  9       'hnfTOyr$ ?Eos Not Estab. % 1  0  1       ?Basos Not Estab. % 1  0  0       ?Neutrophils Absolute 1.4 - 7

## 2021-12-18 ENCOUNTER — Other Ambulatory Visit: Payer: Self-pay | Admitting: Physician Assistant

## 2021-12-18 DIAGNOSIS — J020 Streptococcal pharyngitis: Secondary | ICD-10-CM

## 2021-12-18 DIAGNOSIS — Z1152 Encounter for screening for COVID-19: Secondary | ICD-10-CM

## 2021-12-18 LAB — POC COVID19 BINAXNOW: SARS Coronavirus 2 Ag: NEGATIVE

## 2021-12-18 LAB — POCT RAPID STREP A (OFFICE): Rapid Strep A Screen: POSITIVE — AB

## 2021-12-18 MED ORDER — AMOXICILLIN 875 MG PO TABS: 875.0000 mg | ORAL_TABLET | Freq: Two times a day (BID) | ORAL | 0 refills | Status: AC

## 2021-12-18 MED ORDER — PSEUDOEPH-BROMPHEN-DM 30-2-10 MG/5ML PO SYRP: 5.0000 mL | ORAL_SOLUTION | Freq: Four times a day (QID) | ORAL | 0 refills | Status: DC | PRN

## 2021-12-18 NOTE — Progress Notes (Signed)
Pt presents today with sore throat,stuffy nose and fatigue. ?Strept and rapid covid was performed. ?

## 2021-12-18 NOTE — Progress Notes (Signed)
? ?  Subjective: Sore throat and nasal congestion  ? ? Patient ID: David Luna, male    DOB: November 06, 1985, 36 y.o.   MRN: [id number] ? ?HPI ?Patient working with sore throat and nasal congestion.  Denies recent travel or known contact with COVID-19.  Patient tested negative for COVID-19 this morning but positive for strep pharyngitis. ? ? ?Review of Systems ?Negative except for chief complaint ?   ?Objective:  ? Physical Exam ? ?This is a virtual visit ? ? ?   ?Assessment & Plan: Strep pharyngitis and nasal congestion  ? ?Patient given a prescription for amoxicillin and Bromfed-DM.  Patient advised take medication as directed and follow-up if no improvement or worsening complaint. ?

## 2022-02-08 ENCOUNTER — Ambulatory Visit: Payer: Self-pay | Admitting: Physician Assistant

## 2022-02-08 ENCOUNTER — Encounter: Payer: Self-pay | Admitting: Physician Assistant

## 2022-02-08 DIAGNOSIS — L918 Other hypertrophic disorders of the skin: Secondary | ICD-10-CM

## 2022-02-08 NOTE — Progress Notes (Signed)
   Subjective: Skin tag    Patient ID: David Luna, male    DOB: Dec 04, 1985, 36 y.o.   MRN: [id number]  HPI Patient requests removal small skin tag to the right anterior neck.   Review of Systems Negative except for chief complaint    Objective:   Physical Exam  Patient has 2 mm skin tag right anterior neck.      Assessment & Plan: Skin tag   Patient gave verbal consent for cryotherapy to remove lesion.  Patient will follow-up in 1 week.

## 2022-02-08 NOTE — Progress Notes (Signed)
Pt presents today to have skin tag on neck removed.

## 2022-02-28 ENCOUNTER — Other Ambulatory Visit: Payer: Self-pay

## 2022-02-28 DIAGNOSIS — E782 Mixed hyperlipidemia: Secondary | ICD-10-CM

## 2022-02-28 NOTE — Progress Notes (Signed)
Pt presents today to recheck lipid./Cl,RMA

## 2022-03-01 LAB — LIPID PANEL
Chol/HDL Ratio: 3.7 ratio (ref 0.0–5.0)
Cholesterol, Total: 157 mg/dL (ref 100–199)
HDL: 43 mg/dL (ref >39)
LDL Chol Calc (NIH): 90 mg/dL (ref 0–99)
Triglycerides: 134 mg/dL (ref 0–149)
VLDL Cholesterol Cal: 24 mg/dL (ref 5–40)

## 2022-03-19 ENCOUNTER — Ambulatory Visit
Admission: RE | Admit: 2022-03-19 | Discharge: 2022-03-19 | Disposition: A | Discharge status: Home / Self Care | Payer: 59 | Source: Ambulatory Visit | Attending: Internal Medicine | Admitting: Internal Medicine

## 2022-03-19 VITALS — BP 138/84 | HR 88 | Temp 97.4°F | Resp 18

## 2022-03-19 DIAGNOSIS — J029 Acute pharyngitis, unspecified: Secondary | ICD-10-CM | POA: Diagnosis not present

## 2022-03-19 LAB — POCT RAPID STREP A (OFFICE): Rapid Strep A Screen: NEGATIVE

## 2022-03-19 NOTE — Discharge Instructions (Signed)
Warm salt water gargle Chloraseptic throat spray Your strep test is negative We will send the strep for cultures and we will call you if your labs are abnormal Maintain adequate hydration Tylenol or Motrin as needed for pain and/or fever Return to urgent care if symptoms worsen.

## 2022-03-19 NOTE — ED Triage Notes (Signed)
Patient presents to Urgent Care with complaints of sore throat  since yesterday. Patient reports his son has been sick on the way home from vacation two days with same symptoms.

## 2022-03-20 LAB — CULTURE, GROUP A STREP (THRC)

## 2022-03-21 NOTE — ED Provider Notes (Signed)
[facility]    CSN: [id number] Arrival date & time: 03/19/22  0849      History   Chief Complaint Chief Complaint  Patient presents with   Sore Throat    Test for strep, possible direct exposure with symptomatic child - Entered by patient    HPI David Luna is a 36 y.o. male comes to urgent care with 1 day history of sore throat.  Patient comes to urgent care for strep testing.  Both his wife and 37-year-old son have similar symptoms.  He denies any sore throat at this time.  No nausea or vomiting.  No diarrhea.  No headaches or generalized body aches.  Family just returned from a vacation.Marland Kitchen   HPI  Past Medical History:  Diagnosis Date   Asthma    As a small child. Pt reports he outgrew.    Elevated ALT measurement    Elevated lipids    HSV-2 seropositive    IBS (irritable bowel syndrome)    Increased BMI     Patient Active Problem List   Diagnosis Date Noted   Other seborrheic keratosis 08/15/2021   Sebaceous cyst 08/15/2021   Healthcare maintenance 12/08/2018   BMI 33.0-33.9,adult 12/08/2018   Herpes simplex type 2 infection 04/27/2015    Past Surgical History:  Procedure Laterality Date   WISDOM TOOTH EXTRACTION  36 years old       Home Medications    Prior to Admission medications   Medication Sig Start Date End Date Taking? Authorizing Provider  rosuvastatin (CRESTOR) 40 MG tablet Take 1 tablet (40 mg total) by mouth daily. 11/28/21   Joni Reining, PA-C  valACYclovir (VALTREX) 500 MG tablet Take 1 tablet (500 mg total) by mouth daily. 04/13/21   Joni Reining, PA-C    Family History Family History  Problem Relation Age of Onset   Hypertension Mother    Hypertension Father    Diabetes Maternal Grandfather     Social History Social History   Tobacco Use   Smoking status: Never   Smokeless tobacco: Never  Vaping Use   Vaping Use: Never used  Substance Use Topics   Alcohol use: Yes    Alcohol/week: 5.0 standard drinks of  alcohol    Types: 5 Cans of beer per week   Drug use: No     Allergies   Patient has no known allergies.   Review of Systems Review of Systems  HENT:  Positive for sore throat.   Respiratory: Negative.    Cardiovascular: Negative.   Gastrointestinal: Negative.      Physical Exam Triage Vital Signs ED Triage Vitals  Enc Vitals Group     BP 03/19/22 0858 138/84     Pulse Rate 03/19/22 0858 88     Resp 03/19/22 0858 18     Temp 03/19/22 0858 (!) 97.4 F (36.3 C)     Temp src --      SpO2 03/19/22 0858 98 %     Weight --      Height --      Head Circumference --      Peak Flow --      Pain Score 03/19/22 0857 0     Pain Loc --      Pain Edu? --      Excl. in GC? --    No data found.  Updated Vital Signs BP 138/84   Pulse 88   Temp (!) 97.4 F (36.3 C)  Resp 18   SpO2 98%   Visual Acuity Right Eye Distance:   Left Eye Distance:   Bilateral Distance:    Right Eye Near:   Left Eye Near:    Bilateral Near:     Physical Exam Vitals and nursing note reviewed.  Constitutional:      General: He is not in acute distress.    Appearance: He is well-developed. He is not ill-appearing.  HENT:     Right Ear: Tympanic membrane normal.     Left Ear: Tympanic membrane normal.     Mouth/Throat:     Mouth: Mucous membranes are moist. Mucous membranes are pale.     Pharynx: No posterior oropharyngeal erythema.  Cardiovascular:     Rate and Rhythm: Normal rate and regular rhythm.  Pulmonary:     Effort: Pulmonary effort is normal.     Breath sounds: Normal breath sounds.  Neurological:     Mental Status: He is alert.      UC Treatments / Results  Labs (all labs ordered are listed, but only abnormal results are displayed) Labs Reviewed  CULTURE, GROUP A STREP [facility])  POCT RAPID STREP A (OFFICE)    EKG   Radiology No results found.  Procedures Procedures (including critical care time)  Medications Ordered in UC Medications - No data to  display  Initial Impression / Assessment and Plan / UC Course  I have reviewed the triage vital signs and the nursing notes.  Pertinent labs & imaging results that were available during my care of the patient were reviewed by me and considered in my medical decision making (see chart for details).     1.  Acute viral pharyngitis: Point-of-care strep test is negative Warm salt water gargle Chloraseptic throat spray Tylenol/Motrin as needed for pain and/or fever Return precautions given We will call patient with recommendations if labs are abnormal. Final Clinical Impressions(s) / UC Diagnoses   Final diagnoses:  Acute viral pharyngitis     Discharge Instructions      Warm salt water gargle Chloraseptic throat spray Your strep test is negative We will send the strep for cultures and we will call you if your labs are abnormal Maintain adequate hydration Tylenol or Motrin as needed for pain and/or fever Return to urgent care if symptoms worsen.   ED Prescriptions   None    PDMP not reviewed this encounter.   Merrilee Jansky, MD 03/21/22 [phone]

## 2022-03-22 LAB — CULTURE, GROUP A STREP (THRC)

## 2022-04-19 ENCOUNTER — Other Ambulatory Visit: Payer: Self-pay

## 2022-04-19 DIAGNOSIS — B009 Herpesviral infection, unspecified: Secondary | ICD-10-CM

## 2022-04-23 ENCOUNTER — Encounter: Payer: Self-pay | Admitting: Physician Assistant

## 2022-04-23 ENCOUNTER — Ambulatory Visit: Payer: Self-pay | Admitting: Physician Assistant

## 2022-04-23 DIAGNOSIS — B009 Herpesviral infection, unspecified: Secondary | ICD-10-CM

## 2022-04-23 MED ORDER — VALACYCLOVIR HCL 500 MG PO TABS: 500.0000 mg | ORAL_TABLET | Freq: Every day | ORAL | 3 refills | Status: DC

## 2022-04-23 NOTE — Progress Notes (Signed)
   Subjective: Medication refill    Patient ID: David Luna, male    DOB: [birth year], 36 y.o.   MRN: [id number]  HPI Patient is here for medication refill of Valtrex for herpes simplex 2.  Patient has been using his medication for suppressive therapy since 2016.  Patient stated no outbreaks.  Patient stated continue being a monogamous relationship.  Patient has never stopped taking medication since 2016.   Review of Systems Negative except for above complaint.    Objective:   Physical Exam BP is 125/88, pulse 72, respiration 11, temperature 97.1, and patient 97% O2 sat on room air.       Assessment & Plan: Medication refill for Valtrex.

## 2022-05-09 DIAGNOSIS — J069 Acute upper respiratory infection, unspecified: Secondary | ICD-10-CM | POA: Diagnosis not present

## 2022-05-09 DIAGNOSIS — U071 COVID-19: Secondary | ICD-10-CM | POA: Diagnosis not present

## 2022-06-13 ENCOUNTER — Encounter: Payer: Self-pay | Admitting: Physician Assistant

## 2022-08-22 ENCOUNTER — Other Ambulatory Visit: Payer: Self-pay

## 2022-08-22 DIAGNOSIS — E782 Mixed hyperlipidemia: Secondary | ICD-10-CM

## 2022-08-22 NOTE — Progress Notes (Signed)
Pt completed lipid panel. 

## 2022-08-23 LAB — LIPID PANEL
Chol/HDL Ratio: 3.9 ratio (ref 0.0–5.0)
Cholesterol, Total: 145 mg/dL (ref 100–199)
HDL: 37 mg/dL — ABNORMAL LOW (ref >39)
LDL Chol Calc (NIH): 78 mg/dL (ref 0–99)
Triglycerides: 173 mg/dL — ABNORMAL HIGH (ref 0–149)
VLDL Cholesterol Cal: 30 mg/dL (ref 5–40)

## 2022-08-28 ENCOUNTER — Ambulatory Visit: Payer: 59 | Admitting: Physician Assistant

## 2022-08-29 ENCOUNTER — Encounter: Payer: Self-pay | Admitting: Physician Assistant

## 2022-08-29 ENCOUNTER — Ambulatory Visit: Payer: Self-pay | Admitting: Physician Assistant

## 2022-08-29 DIAGNOSIS — E782 Mixed hyperlipidemia: Secondary | ICD-10-CM

## 2022-08-29 MED ORDER — ROSUVASTATIN CALCIUM 40 MG PO TABS: 40.0000 mg | ORAL_TABLET | Freq: Every day | ORAL | 3 refills | Status: DC

## 2022-08-29 NOTE — Progress Notes (Signed)
   Subjective: Hyperlipidemia    Patient ID: David Luna, male    DOB: [birth year], 36 y.o.   MRN: [id number]  HPI Patient is follow-up for labs with fasting lipids.  Patient currently taking Crestor 40 mg daily.   Review of Systems Negative except above    Objective:   Physical Exam  Patient initially on Lipitor to 46 for cholesterol which now has improved to 145.  Patient triglycerides were 239 and is now 173.  Patient HDL was 42 and is now 137.     Assessment & Plan: Mixed hyperlipidemia   Patient encouraged to continue taking medication and diet modification.  Will follow-up in 6 months.

## 2022-11-12 ENCOUNTER — Ambulatory Visit: Payer: Self-pay | Admitting: Physician Assistant

## 2022-11-12 ENCOUNTER — Other Ambulatory Visit: Payer: Self-pay | Admitting: Physician Assistant

## 2022-11-12 ENCOUNTER — Ambulatory Visit
Admission: RE | Admit: 2022-11-12 | Discharge: 2022-11-12 | Disposition: A | Discharge status: Home / Self Care | Payer: 59 | Source: Ambulatory Visit | Attending: Physician Assistant | Admitting: Physician Assistant

## 2022-11-12 ENCOUNTER — Ambulatory Visit
Admission: RE | Admit: 2022-11-12 | Discharge: 2022-11-12 | Disposition: A | Discharge status: Home / Self Care | Payer: 59 | Attending: Physician Assistant | Admitting: Physician Assistant

## 2022-11-12 ENCOUNTER — Encounter: Payer: Self-pay | Admitting: Physician Assistant

## 2022-11-12 VITALS — BP 128/83 | HR 71 | Resp 14 | Ht 78.0 in | Wt 290.0 lb

## 2022-11-12 DIAGNOSIS — M5137 Other intervertebral disc degeneration, lumbosacral region: Secondary | ICD-10-CM | POA: Diagnosis not present

## 2022-11-12 DIAGNOSIS — S39012A Strain of muscle, fascia and tendon of lower back, initial encounter: Secondary | ICD-10-CM | POA: Insufficient documentation

## 2022-11-12 DIAGNOSIS — M545 Low back pain, unspecified: Secondary | ICD-10-CM | POA: Diagnosis not present

## 2022-11-12 MED ORDER — ORPHENADRINE CITRATE ER 100 MG PO TB12: 100.0000 mg | ORAL_TABLET | Freq: Two times a day (BID) | ORAL | 0 refills | Status: DC

## 2022-11-12 MED ORDER — KETOROLAC TROMETHAMINE 60 MG/2ML IM SOLN
60.0000 mg | Freq: Once | INTRAMUSCULAR | Status: AC
  Administered 2022-11-12: 60 mg via INTRAMUSCULAR

## 2022-11-12 MED ORDER — NAPROXEN 500 MG PO TABS: 500.0000 mg | ORAL_TABLET | Freq: Two times a day (BID) | ORAL | Status: DC

## 2022-11-12 NOTE — Progress Notes (Deleted)
   Subjective: Acute low back pain    Patient ID: David Luna, male    DOB: 10-Oct-1985, 37 y.o.   MRN: [id number]  HPI Patient here today for lower back pain. Pain ongoing since last Wednesday and has gradually worsened. States he believes he hurt himself using a [employer]. The pain is primarily localized to the right of lower spine. States no bladder or bowel dysfunction.   Review of Systems Herpes simplex and sebaceous cyst.    Objective:   Physical Exam BP 128/83, pulse 71, respiration 14, patient 97% O2 sat on room air.  Patient weighs 290 pounds and BMI is 33.51.  No obvious deformity. Mild to moderate guarding with palpation to right perispinal muscle area. Straight leg performed and negative.       Assessment & Plan: lumbar strain  Toradol 60 mg IM followed by prescription of Norflex and Naproxen. Follow up in two days. Work note given.

## 2022-11-12 NOTE — Progress Notes (Signed)
Pt woke up Saturday morning with lower back pain. Pt woke up yesterday barley able to move. As of today its worst then Saturday but better then yesterday (Sunday).

## 2022-11-14 ENCOUNTER — Encounter: Payer: Self-pay | Admitting: Physician Assistant

## 2022-11-14 ENCOUNTER — Ambulatory Visit: Payer: Self-pay | Admitting: Physician Assistant

## 2022-11-14 DIAGNOSIS — S39012D Strain of muscle, fascia and tendon of lower back, subsequent encounter: Secondary | ICD-10-CM

## 2022-11-14 NOTE — Progress Notes (Signed)
   Subjective: Lumbar strain    Patient ID: David Luna, male    DOB: [birth year], 37 y.o.   MRN: [id number]  HPI Patient presents for follow-up low back pain secondary to a "stationary rowing  boat" exercise session.  Patient was seen 2 days ago and given injection of Toradol followed by a prescription for Norflex and naproxen.  Patient reports today back pain is a lot better and request to go back to full duties.   Review of Systems Negative except for above complaint   Objective:   Physical Exam  See nurses notes and vital signs. Patient no acute distress.  No obvious deformity to the lumbar spine.  Patient has increased range of motion with flexion, extension, and lateral movements.  No paraspinal muscle spasms.  Nontender to palpation of spinous processes.      Assessment & Plan: Lumbar strain lumbar strain   Patient return back to full duties.  Vies follow-up if condition worsens.

## 2022-11-14 NOTE — Progress Notes (Signed)
Pt presents\ today  today for lower back pain follow up . Pt states its a lot better.Burna Sis

## 2022-11-26 ENCOUNTER — Ambulatory Visit: Payer: Self-pay

## 2022-11-26 DIAGNOSIS — Z0000 Encounter for general adult medical examination without abnormal findings: Secondary | ICD-10-CM

## 2022-11-26 LAB — POCT URINALYSIS DIPSTICK
Bilirubin, UA: NEGATIVE
Blood, UA: NEGATIVE
Glucose, UA: NEGATIVE
Ketones, UA: NEGATIVE
Leukocytes, UA: NEGATIVE
Nitrite, UA: NEGATIVE
Protein, UA: NEGATIVE
Spec Grav, UA: 1.02 (ref 1.010–1.025)
Urobilinogen, UA: 0.2 E.U./dL
pH, UA: 6 (ref 5.0–8.0)

## 2022-11-26 NOTE — Progress Notes (Signed)
Pt presents today to complete lab portion of physical. /CL,RMA 

## 2022-11-27 LAB — CMP12+LP+TP+TSH+6AC+CBC/D/PLT
ALT: 35 IU/L (ref 0–44)
AST: 25 IU/L (ref 0–40)
Albumin/Globulin Ratio: 2.1 (ref 1.2–2.2)
Albumin: 4.7 g/dL (ref 4.1–5.1)
Alkaline Phosphatase: 58 IU/L (ref 44–121)
BUN/Creatinine Ratio: 17 (ref 9–20)
BUN: 12 mg/dL (ref 6–20)
Basophils Absolute: 0 10*3/uL (ref 0.0–0.2)
Basos: 1 %
Bilirubin Total: 0.4 mg/dL (ref 0.0–1.2)
Calcium: 9.7 mg/dL (ref 8.7–10.2)
Chloride: 101 mmol/L (ref 96–106)
Chol/HDL Ratio: 3.8 ratio (ref 0.0–5.0)
Cholesterol, Total: 149 mg/dL (ref 100–199)
Creatinine, Ser: 0.7 mg/dL — ABNORMAL LOW (ref 0.76–1.27)
EOS (ABSOLUTE): 0.1 10*3/uL (ref 0.0–0.4)
Eos: 1 %
Estimated CHD Risk: 0.7 times avg. (ref 0.0–1.0)
Free Thyroxine Index: 2.1 (ref 1.2–4.9)
GGT: 27 IU/L (ref 0–65)
Globulin, Total: 2.2 g/dL (ref 1.5–4.5)
Glucose: 97 mg/dL (ref 70–99)
HDL: 39 mg/dL — ABNORMAL LOW (ref >39)
Hematocrit: 43.9 % (ref 37.5–51.0)
Hemoglobin: 15.3 g/dL (ref 13.0–17.7)
Immature Grans (Abs): 0 10*3/uL (ref 0.0–0.1)
Immature Granulocytes: 0 %
Iron: 73 ug/dL (ref 38–169)
LDH: 157 IU/L (ref 121–224)
LDL Chol Calc (NIH): 81 mg/dL (ref 0–99)
Lymphocytes Absolute: 1.6 10*3/uL (ref 0.7–3.1)
Lymphs: 30 %
MCH: 30.1 pg (ref 26.6–33.0)
MCHC: 34.9 g/dL (ref 31.5–35.7)
MCV: 86 fL (ref 79–97)
Monocytes Absolute: 0.4 10*3/uL (ref 0.1–0.9)
Monocytes: 8 %
Neutrophils Absolute: 3.3 10*3/uL (ref 1.4–7.0)
Neutrophils: 60 %
Phosphorus: 3.8 mg/dL (ref 2.8–4.1)
Platelets: 166 10*3/uL (ref 150–450)
Potassium: 3.9 mmol/L (ref 3.5–5.2)
RBC: 5.08 x10E6/uL (ref 4.14–5.80)
RDW: 12.3 % (ref 11.6–15.4)
Sodium: 142 mmol/L (ref 134–144)
T3 Uptake Ratio: 25 % (ref 24–39)
T4, Total: 8.5 ug/dL (ref 4.5–12.0)
TSH: 1.66 u[IU]/mL (ref 0.450–4.500)
Total Protein: 6.9 g/dL (ref 6.0–8.5)
Triglycerides: 171 mg/dL — ABNORMAL HIGH (ref 0–149)
Uric Acid: 4.5 mg/dL (ref 3.8–8.4)
VLDL Cholesterol Cal: 29 mg/dL (ref 5–40)
WBC: 5.4 10*3/uL (ref 3.4–10.8)
eGFR: 121 mL/min/{1.73_m2} (ref >59)

## 2022-12-03 ENCOUNTER — Encounter: Payer: 59 | Admitting: Physician Assistant

## 2022-12-03 ENCOUNTER — Ambulatory Visit: Payer: Self-pay | Admitting: Physician Assistant

## 2022-12-03 ENCOUNTER — Encounter: Payer: Self-pay | Admitting: Physician Assistant

## 2022-12-03 VITALS — BP 112/76 | HR 72 | Temp 97.3°F | Resp 12 | Ht 78.0 in | Wt 302.0 lb

## 2022-12-03 DIAGNOSIS — Z0000 Encounter for general adult medical examination without abnormal findings: Secondary | ICD-10-CM

## 2022-12-03 NOTE — Progress Notes (Signed)
[facility]   ____________________________________________   None    (approximate)  I have reviewed the triage vital signs and the nursing notes.   HISTORY  Chief Complaint Annual Exam   HPI David Luna is a 37 y.o. male who presents for his annual physical exam. In no acute distress, appears in good general health. Reports no concerns or complaints and taking Crestor as prescribed.      Past Medical History:  Diagnosis Date   Asthma    As a small child. Pt reports he outgrew.    Elevated ALT measurement    Elevated lipids    HSV-2 seropositive    IBS (irritable bowel syndrome)    Increased BMI     Patient Active Problem List   Diagnosis Date Noted   Other seborrheic keratosis 08/15/2021   Sebaceous cyst 08/15/2021   Healthcare maintenance 12/08/2018   BMI 33.0-33.9,adult 12/08/2018   Herpes simplex type 2 infection 04/27/2015    Past Surgical History:  Procedure Laterality Date   WISDOM TOOTH EXTRACTION  37 years old    Prior to Admission medications   Medication Sig Start Date End Date Taking? Authorizing Provider  Multiple Vitamin (MULTIVITAMIN) tablet Take 1 tablet by mouth daily.   Yes [provider]  rosuvastatin (CRESTOR) 40 MG tablet Take 1 tablet (40 mg total) by mouth daily. 08/29/22  Yes Sable Feil, PA-C  valACYclovir (VALTREX) 500 MG tablet Take 1 tablet (500 mg total) by mouth daily. 04/23/22   Sable Feil, PA-C    Allergies Patient has no known allergies.  Family History  Problem Relation Age of Onset   Hypertension Mother    Hypertension Father    Diabetes Maternal Grandfather     Social History Social History   Tobacco Use   Smoking status: Never   Smokeless tobacco: Never  Vaping Use   Vaping Use: Never used  Substance Use Topics   Alcohol use: Yes    Alcohol/week: 5.0 standard drinks of alcohol    Types: 5 Cans of beer per week   Drug use: No    Review of  Systems Constitutional: No fever/chills Eyes: No visual changes. ENT: No sore throat. Cardiovascular: Denies chest pain. Respiratory: Denies shortness of breath. Gastrointestinal: No abdominal pain.  No nausea, no vomiting.  No diarrhea.  No constipation. Genitourinary: Negative for dysuria. Musculoskeletal: Negative for back pain. Skin: Negative for rash. Neurological: Negative for headaches, focal weakness or numbness. ____________________________________________  PHYSICAL EXAM:  VITAL SIGNS: BP 112/76, P 72, T 97.47F, SpO2 98%, Wt 302 lb, BMI 34.9 Constitutional: Alert and oriented. Well appearing and in no acute distress. Eyes: Conjunctivae are normal. PERRL. EOMI. Head: Atraumatic. Nose: No congestion/rhinnorhea. Mouth/Throat: Mucous membranes are moist.  Oropharynx non-erythematous. Neck: No stridor. No cervical spine tenderness to palpation. Hematological/Lymphatic/Immunilogical: No cervical lymphadenopathy. Cardiovascular: Normal rate, regular rhythm. Grossly normal heart sounds.  Good peripheral circulation. Respiratory: Normal respiratory effort.  No retractions. Lungs CTAB. Gastrointestinal: Soft and nontender. No distention. No abdominal bruits. No CVA tenderness. Musculoskeletal: No lower extremity tenderness nor edema.  No joint effusions. Neurologic: Normal speech and language. No gross focal neurologic deficits are appreciated. No gait instability. Skin:  Skin is warm, dry and intact. No rash noted. Psychiatric: Mood and affect are normal. Speech and behavior are normal.  ____________________________________________   LABS          Component Ref Range & Units 7 d ago (11/26/22) 1 yr ago (11/24/21) 1  yr ago (02/15/21) 2 yr ago (02/29/20)  Color, UA yellow yellow yellow Dark Yellow  Clarity, UA clear clear clear Clear  Glucose, UA Negative Negative Negative Negative Negative  Bilirubin, UA negative negative negative Negative  Ketones, UA negative negative  negative Negative  Spec Grav, UA 1.010 - 1.025 1.020 1.020 1.010 >=1.030 Abnormal   Blood, UA neg negative negative Negative  pH, UA 5.0 - 8.0 6.0 6.0 6.0 6.0  Protein, UA Negative Negative Negative Negative Negative  Urobilinogen, UA 0.2 or 1.0 E.U./dL 0.2 0.2 0.2 0.2  Nitrite, UA neg negative negative Negative  Leukocytes, UA Negative Negative Negative Negative Negative  Appearance   light   Odor                                 Component Ref Range & Units 7 d ago (11/26/22) 3 mo ago (08/22/22) 9 mo ago (02/28/22) 1 yr ago (11/24/21) 1 yr ago (02/15/21) 2 yr ago (02/29/20) 4 yr ago (09/02/18) 4 yr ago (09/02/18)  Glucose 70 - 99 mg/dL 97   100 High  101 High  R 97 R    Uric Acid 3.8 - 8.4 mg/dL 4.5   5.1 CM 4.7 CM 5.4 CM    Comment:            Therapeutic target for gout patients: <6.0  BUN 6 - 20 mg/dL 12   11 12 16     Creatinine, Ser 0.76 - 1.27 mg/dL 0.70 Low    0.73 Low  0.69 Low  0.77    eGFR >59 mL/min/1.73 121   120 123     BUN/Creatinine Ratio 9 - 20 17   15 17 21  High     Sodium 134 - 144 mmol/L 142   139 139 141    Potassium 3.5 - 5.2 mmol/L 3.9   4.4 4.1 4.1    Chloride 96 - 106 mmol/L 101   101 102 102    Calcium 8.7 - 10.2 mg/dL 9.7   9.7 9.5 9.4    Phosphorus 2.8 - 4.1 mg/dL 3.8   3.6 3.1 3.4    Total Protein 6.0 - 8.5 g/dL 6.9   6.9 6.6 6.5    Albumin 4.1 - 5.1 g/dL 4.7   4.8 R 4.6 R 4.5 R    Globulin, Total 1.5 - 4.5 g/dL 2.2   2.1 2.0 2.0    Albumin/Globulin Ratio 1.2 - 2.2 2.1   2.3 High  2.3 High  2.3 High     Bilirubin Total 0.0 - 1.2 mg/dL 0.4   0.3 0.3 0.3    Alkaline Phosphatase 44 - 121 IU/L 58   61 55 55 R    LDH 121 - 224 IU/L 157   153 157 145    AST 0 - 40 IU/L 25   26 21 21 25  R   ALT 0 - 44 IU/L 35   48 High  34 39 53 Abnormal  R   GGT 0 - 65 IU/L 27   35 23 31    Iron 38 - 169 ug/dL 73   77 66 66    Cholesterol, Total 100 - 199 mg/dL 149 145 157 246 High  223 High  226 High     Triglycerides 0 - 149 mg/dL 171  High  173 High  134 239 High  162 High  205 High   103 R  HDL >39  mg/dL 39 Low  37 Low  43 42 40 33 Low   37 R  VLDL Cholesterol Cal 5 - 40 mg/dL 29 30 24  44 High  30 38    LDL Chol Calc (NIH) 0 - 99 mg/dL 81 78 90 160 High  153 High  155 High     Chol/HDL Ratio 0.0 - 5.0 ratio 3.8 3.9 CM 3.7 CM 5.9 High  CM 5.6 High  CM 6.8 High  CM    Comment:                                   T. Chol/HDL Ratio                                             Men  Women                               1/2 Avg.Risk  3.4    3.3                                   Avg.Risk  5.0    4.4                                2X Avg.Risk  9.6    7.1                                3X Avg.Risk 23.4   11.0  Estimated CHD Risk 0.0 - 1.0 times avg. 0.7   1.2 High  CM 1.2 High  CM 1.4 High  CM    Comment: The CHD Risk is based on the T. Chol/HDL ratio. Other factors affect CHD Risk such as hypertension, smoking, diabetes, severe obesity, and family history of premature CHD.  TSH 0.450 - 4.500 uIU/mL 1.660   1.460 1.480 2.330    T4, Total 4.5 - 12.0 ug/dL 8.5   8.1 7.7 7.0    T3 Uptake Ratio 24 - 39 % 25   22 Low  23 Low  26    Free Thyroxine Index 1.2 - 4.9 2.1   1.8 1.8 1.8    WBC 3.4 - 10.8 x10E3/uL 5.4   6.2 5.0 4.8    RBC 4.14 - 5.80 x10E6/uL 5.08   5.21 5.01 5.00    Hemoglobin 13.0 - 17.7 g/dL 15.3   15.3 14.9 14.9    Hematocrit 37.5 - 51.0 % 43.9   43.9 42.9 42.5    MCV 79 - 97 fL 86   84 86 85    MCH 26.6 - 33.0 pg 30.1   29.4 29.7 29.8    MCHC 31.5 - 35.7 g/dL 34.9   34.9 34.7 35.1    RDW 11.6 - 15.4 % 12.3   12.3 12.6 12.6    Platelets 150 - 450 x10E3/uL 166   193 183 197    Neutrophils Not Estab. % 60   55 58 57    Lymphs Not Estab. % 30   35 34 33    Monocytes Not Estab. %  8   8 8 9     Eos Not Estab. % 1   1 0 1    Basos Not Estab. % 1   1 0 0    Neutrophils Absolute 1.4 - 7.0 x10E3/uL 3.3   3.4 2.9 2.7    Lymphocytes Absolute 0.7 - 3.1 x10E3/uL 1.6   2.2 1.7 1.6    Monocytes  Absolute 0.1 - 0.9 x10E3/uL 0.4   0.5 0.4 0.4    EOS (ABSOLUTE) 0.0 - 0.4 x10E3/uL 0.1   0.0 0.0 0.0    Basophils Absolute 0.0 - 0.2 x10E3/uL 0.0   0.0 0.0 0.0    Immature Granulocytes Not Estab. % 0   0 0 0    Immature Grans (Abs) 0.0 - 0.1 x10E3/uL 0.0   0.0 0.0 0.0    Resulting Agency [facility]              Component Ref Range & Units 7 d ago (11/26/22) 1 yr ago (11/24/21) 1 yr ago (02/15/21) 2 yr ago (02/29/20)  Color, UA yellow yellow yellow Dark Yellow  Clarity, UA clear clear clear Clear  Glucose, UA Negative Negative Negative Negative Negative  Bilirubin, UA negative negative negative Negative  Ketones, UA negative negative negative Negative  Spec Grav, UA 1.010 - 1.025 1.020 1.020 1.010 >=1.030 Abnormal   Blood, UA neg negative negative Negative  pH, UA 5.0 - 8.0 6.0 6.0 6.0 6.0  Protein, UA Negative Negative Negative Negative Negative  Urobilinogen, UA 0.2 or 1.0 E.U./dL 0.2 0.2 0.2 0.2  Nitrite, UA neg negative negative Negative  Leukocytes, UA Negative Negative Negative Negative Negative  Appearance   light   Odor                        Component Ref Range & Units 7 d ago (11/26/22) 3 mo ago (08/22/22) 9 mo ago (02/28/22) 1 yr ago (11/24/21) 1 yr ago (02/15/21) 2 yr ago (02/29/20) 4 yr ago (09/02/18) 4 yr ago (09/02/18)  Glucose 70 - 99 mg/dL 97   100 High  101 High  R 97 R    Uric Acid 3.8 - 8.4 mg/dL 4.5   5.1 CM 4.7 CM 5.4 CM    Comment:            Therapeutic target for gout patients: <6.0  BUN 6 - 20 mg/dL 12   11 12 16     Creatinine, Ser 0.76 - 1.27 mg/dL 0.70 Low    0.73 Low  0.69 Low  0.77    eGFR >59 mL/min/1.73 121   120 123     BUN/Creatinine Ratio 9 - 20 17   15 17 21  High     Sodium 134 - 144 mmol/L 142   139 139 141    Potassium 3.5 - 5.2 mmol/L 3.9   4.4 4.1 4.1    Chloride 96 - 106 mmol/L 101   101 102 102    Calcium 8.7 - 10.2 mg/dL 9.7   9.7 9.5 9.4     Phosphorus 2.8 - 4.1 mg/dL 3.8   3.6 3.1 3.4    Total Protein 6.0 - 8.5 g/dL 6.9   6.9 6.6 6.5    Albumin 4.1 - 5.1 g/dL 4.7   4.8 R 4.6 R 4.5 R    Globulin, Total 1.5 - 4.5 g/dL 2.2   2.1 2.0 2.0    Albumin/Globulin Ratio 1.2 - 2.2 2.1  2.3 High  2.3 High  2.3 High     Bilirubin Total 0.0 - 1.2 mg/dL 0.4   0.3 0.3 0.3    Alkaline Phosphatase 44 - 121 IU/L 58   61 55 55 R    LDH 121 - 224 IU/L 157   153 157 145    AST 0 - 40 IU/L 25   26 21 21 25  R   ALT 0 - 44 IU/L 35   48 High  34 39 53 Abnormal  R   GGT 0 - 65 IU/L 27   35 23 31    Iron 38 - 169 ug/dL 73   77 66 66    Cholesterol, Total 100 - 199 mg/dL 149 145 157 246 High  223 High  226 High     Triglycerides 0 - 149 mg/dL 171 High  173 High  134 239 High  162 High  205 High   103 R  HDL >39 mg/dL 39 Low  37 Low  43 42 40 33 Low   37 R  VLDL Cholesterol Cal 5 - 40 mg/dL 29 30 24  44 High  30 38    LDL Chol Calc (NIH) 0 - 99 mg/dL 81 78 90 160 High  153 High  155 High     Chol/HDL Ratio 0.0 - 5.0 ratio 3.8 3.9 CM 3.7 CM 5.9 High  CM 5.6 High  CM 6.8 High  CM    Comment:                                   T. Chol/HDL Ratio                                             Men  Women                               1/2 Avg.Risk  3.4    3.3                                   Avg.Risk  5.0    4.4                                2X Avg.Risk  9.6    7.1                                3X Avg.Risk 23.4   11.0  Estimated CHD Risk 0.0 - 1.0 times avg. 0.7   1.2 High  CM 1.2 High  CM 1.4 High  CM    Comment: The CHD Risk is based on the T. Chol/HDL ratio. Other factors affect CHD Risk such as hypertension, smoking, diabetes, severe obesity, and family history of premature CHD.  TSH 0.450 - 4.500 uIU/mL 1.660   1.460 1.480 2.330    T4, Total 4.5 - 12.0 ug/dL 8.5   8.1 7.7 7.0    T3 Uptake Ratio 24 - 39 % 25   22 Low  23 Low  26    Free Thyroxine Index  1.2 - 4.9 2.1   1.8 1.8 1.8    WBC 3.4 - 10.8 x10E3/uL 5.4   6.2 5.0 4.8     RBC 4.14 - 5.80 x10E6/uL 5.08   5.21 5.01 5.00    Hemoglobin 13.0 - 17.7 g/dL 15.3   15.3 14.9 14.9    Hematocrit 37.5 - 51.0 % 43.9   43.9 42.9 42.5    MCV 79 - 97 fL 86   84 86 85    MCH 26.6 - 33.0 pg 30.1   29.4 29.7 29.8    MCHC 31.5 - 35.7 g/dL 34.9   34.9 34.7 35.1    RDW 11.6 - 15.4 % 12.3   12.3 12.6 12.6    Platelets 150 - 450 x10E3/uL 166   193 183 197    Neutrophils Not Estab. % 60   55 58 57    Lymphs Not Estab. % 30   35 34 33    Monocytes Not Estab. % 8   8 8 9     Eos Not Estab. % 1   1 0 1    Basos Not Estab. % 1   1 0 0    Neutrophils Absolute 1.4 - 7.0 x10E3/uL 3.3   3.4 2.9 2.7    Lymphocytes Absolute 0.7 - 3.1 x10E3/uL 1.6   2.2 1.7 1.6    Monocytes Absolute 0.1 - 0.9 x10E3/uL 0.4   0.5 0.4 0.4    EOS (ABSOLUTE) 0.0 - 0.4 x10E3/uL 0.1   0.0 0.0 0.0    Basophils Absolute 0.0 - 0.2 x10E3/uL 0.0   0.0 0.0 0.0    Immature Granulocytes Not Estab. % 0   0 0 0    Immature Grans (Abs) 0.0 - 0.1 x10E3/uL 0.0   0.0 0.0 0.0    Resulting Agency [facility]          ____________________________________________   ____________________________________________   INITIAL IMPRESSION / ASSESSMENT AND PLAN  As part of my medical decision making, I reviewed the following data within the electronic MEDICAL RECORD NUMBER   Physical exam benign. Reviewed labs with patient and TG level elevated, recently started on Crestor. Plan to obtain fasting 6 mo TG level to evaluate progress.       ____________________________________________   FINAL CLINICAL IMPRESSION(  Well exam ED Discharge Orders     None        Note:  This document was prepared using Dragon voice recognition software and may include unintentional dictation errors.

## 2023-01-07 ENCOUNTER — Other Ambulatory Visit: Payer: Self-pay

## 2023-01-07 DIAGNOSIS — E782 Mixed hyperlipidemia: Secondary | ICD-10-CM

## 2023-01-07 MED ORDER — ROSUVASTATIN CALCIUM 40 MG PO TABS: 40.0000 mg | ORAL_TABLET | Freq: Every day | ORAL | 3 refills | Status: DC

## 2023-05-01 ENCOUNTER — Ambulatory Visit: Payer: Self-pay | Admitting: Physician Assistant

## 2023-05-01 ENCOUNTER — Other Ambulatory Visit: Payer: Self-pay | Admitting: Physician Assistant

## 2023-05-01 ENCOUNTER — Encounter: Payer: Self-pay | Admitting: Physician Assistant

## 2023-05-01 DIAGNOSIS — B009 Herpesviral infection, unspecified: Secondary | ICD-10-CM

## 2023-05-01 DIAGNOSIS — Z760 Encounter for issue of repeat prescription: Secondary | ICD-10-CM

## 2023-05-01 MED ORDER — VALACYCLOVIR HCL 500 MG PO TABS: 500.0000 mg | ORAL_TABLET | Freq: Every day | ORAL | 3 refills | Status: DC

## 2023-05-01 NOTE — Progress Notes (Signed)
   Subjective: Medication refill    Patient ID: David Luna, male    DOB: August 09, 1986, 37 y.o.   MRN: [id number]  HPI Patient presents for medication refill for Valtrex.  Patient take this medication for suppression therapy.  Patient is taking medication for 8 years.  States no outbreaks.  Patient continue to be in a monogamous relationship.   Review of Systems Negative except for above complaint    Objective:   Physical Exam Deferred       Assessment & Plan: Medication refill  Prescription sent to pharmacy.  Follow-up as necessary.

## 2023-05-01 NOTE — Progress Notes (Signed)
Pt presents today for rx refill on valtrex.

## 2023-05-22 ENCOUNTER — Other Ambulatory Visit: Payer: Self-pay

## 2023-05-22 DIAGNOSIS — E782 Mixed hyperlipidemia: Secondary | ICD-10-CM

## 2023-05-22 NOTE — Progress Notes (Signed)
Pt presents today to complete 6 month lipid panel. David Luna

## 2023-05-23 LAB — LIPID PANEL
Chol/HDL Ratio: 3.6 ratio (ref 0.0–5.0)
Cholesterol, Total: 142 mg/dL (ref 100–199)
HDL: 40 mg/dL (ref >39)
LDL Chol Calc (NIH): 80 mg/dL (ref 0–99)
Triglycerides: 122 mg/dL (ref 0–149)
VLDL Cholesterol Cal: 22 mg/dL (ref 5–40)

## 2023-07-08 ENCOUNTER — Encounter: Payer: Self-pay | Admitting: Physician Assistant

## 2023-07-08 ENCOUNTER — Other Ambulatory Visit: Payer: Self-pay | Admitting: Physician Assistant

## 2023-07-08 ENCOUNTER — Ambulatory Visit: Payer: Self-pay | Admitting: Physician Assistant

## 2023-07-08 VITALS — BP 124/86 | Resp 16 | Ht 78.0 in | Wt 290.0 lb

## 2023-07-08 DIAGNOSIS — M5416 Radiculopathy, lumbar region: Secondary | ICD-10-CM

## 2023-07-08 DIAGNOSIS — G8929 Other chronic pain: Secondary | ICD-10-CM

## 2023-07-08 MED ORDER — ORPHENADRINE CITRATE ER 100 MG PO TB12: 100.0000 mg | ORAL_TABLET | Freq: Two times a day (BID) | ORAL | 0 refills | Status: DC

## 2023-07-08 MED ORDER — METHYLPREDNISOLONE 4 MG PO TBPK: ORAL_TABLET | ORAL | 0 refills | Status: DC

## 2023-07-08 NOTE — Progress Notes (Signed)
Pt presents today with possible sciatic pain (left side) 1 week ago pt says he felt a pain/ burning sensation in left side of waist. As of now this past Saturday 07-06-23 the pain has turned into burning throbbing sensation going down left buttocks, thigh and calf. Pain is worsened by standing or walking. Pain scale 5-6 out of 10

## 2023-07-08 NOTE — Progress Notes (Signed)
  Subjective:     Patient ID: David Luna, male    DOB: [birth year], 37 y.o.   MRN: [id number]  Chief Complaint  Patient presents with   Sciatica    Left side.    HPI Patient presents today for evaluation of left-sided radiating discomfort to lower back and leg x 1 week. Reports initially noticing burning discomfort to the left side of the spine base when adjusting to exit a vehicle. States discomfort has since increased and now pain and a burning sensation radiates from the left lower back to lower leg. Reports using Ibuprofen and a muscle gun with no relief. Denies heavy or strenuous activity prior to initial discomfort.   ROS Negative except for the above complaint.     Objective:    BP 124/86   Resp 16   Ht 6\' 6"  (1.981 m)   Wt 290 lb (131.5 kg)   SpO2 94%   BMI 33.51 kg/m    Physical Exam Constitutional:      Appearance: He is not ill-appearing.  Cardiovascular:     Rate and Rhythm: Normal rate and regular rhythm.  Pulmonary:     Effort: Pulmonary effort is normal.     Breath sounds: Normal breath sounds.  Musculoskeletal:        General: Normal range of motion.  Skin:    Capillary Refill: Capillary refill takes less than 2 seconds.  Neurological:     General: No focal deficit present.     Mental Status: He is alert. Mental status is at baseline.  Psychiatric:        Mood and Affect: Mood normal.        Behavior: Behavior normal.       Assessment & Plan:   Problem List Items Addressed This Visit   None Visit Diagnoses     Acute radicular low back pain    -  Primary      Patient with left oriented lower back and leg pain x 1 week with no noted aggravating injury.   Prescribed Medro doese pack for left radicular back pain, Neuroflex for symptom management, and X-ray imaging of Lumbar Spine.   Instructed to contact clinic with any further needs or concerns.   Delma Post, RN

## 2023-07-09 ENCOUNTER — Ambulatory Visit
Admission: RE | Admit: 2023-07-09 | Discharge: 2023-07-09 | Disposition: A | Discharge status: Home / Self Care | Payer: 59 | Attending: Physician Assistant | Admitting: Physician Assistant

## 2023-07-09 ENCOUNTER — Ambulatory Visit
Admission: RE | Admit: 2023-07-09 | Discharge: 2023-07-09 | Disposition: A | Discharge status: Home / Self Care | Payer: 59 | Source: Ambulatory Visit | Attending: Physician Assistant | Admitting: Physician Assistant

## 2023-07-09 DIAGNOSIS — G8929 Other chronic pain: Secondary | ICD-10-CM | POA: Insufficient documentation

## 2023-07-09 DIAGNOSIS — M549 Dorsalgia, unspecified: Secondary | ICD-10-CM | POA: Diagnosis not present

## 2023-07-09 DIAGNOSIS — M5440 Lumbago with sciatica, unspecified side: Secondary | ICD-10-CM | POA: Diagnosis not present

## 2023-07-09 DIAGNOSIS — M5416 Radiculopathy, lumbar region: Secondary | ICD-10-CM | POA: Diagnosis not present

## 2023-07-11 ENCOUNTER — Encounter: Payer: Self-pay | Admitting: Physician Assistant

## 2023-07-30 ENCOUNTER — Other Ambulatory Visit: Payer: Self-pay | Admitting: Physician Assistant

## 2023-07-30 MED ORDER — ORPHENADRINE CITRATE ER 100 MG PO TB12: 100.0000 mg | ORAL_TABLET | Freq: Two times a day (BID) | ORAL | 0 refills | Status: DC

## 2023-11-04 ENCOUNTER — Ambulatory Visit: Payer: Self-pay

## 2023-11-04 DIAGNOSIS — Z0000 Encounter for general adult medical examination without abnormal findings: Secondary | ICD-10-CM

## 2023-11-04 LAB — POCT URINALYSIS DIPSTICK
Bilirubin, UA: NEGATIVE
Blood, UA: NEGATIVE
Glucose, UA: NEGATIVE
Ketones, UA: NEGATIVE
Leukocytes, UA: NEGATIVE
Nitrite, UA: NEGATIVE
Protein, UA: NEGATIVE
Spec Grav, UA: <=1.005 — AB (ref 1.010–1.025)
Urobilinogen, UA: 0.2 U/dL
pH, UA: 7.5 (ref 5.0–8.0)

## 2023-11-05 LAB — CMP12+LP+TP+TSH+6AC+CBC/D/PLT
ALT: 56 [IU]/L — ABNORMAL HIGH (ref 0–44)
AST: 34 [IU]/L (ref 0–40)
Albumin: 4.4 g/dL (ref 4.1–5.1)
Alkaline Phosphatase: 57 [IU]/L (ref 44–121)
BUN/Creatinine Ratio: 16 (ref 9–20)
BUN: 12 mg/dL (ref 6–20)
Basophils Absolute: 0 10*3/uL (ref 0.0–0.2)
Basos: 1 %
Bilirubin Total: 0.4 mg/dL (ref 0.0–1.2)
Calcium: 9.7 mg/dL (ref 8.7–10.2)
Chloride: 101 mmol/L (ref 96–106)
Chol/HDL Ratio: 6.6 {ratio} — ABNORMAL HIGH (ref 0.0–5.0)
Cholesterol, Total: 245 mg/dL — ABNORMAL HIGH (ref 100–199)
Creatinine, Ser: 0.73 mg/dL — ABNORMAL LOW (ref 0.76–1.27)
EOS (ABSOLUTE): 0 10*3/uL (ref 0.0–0.4)
Eos: 1 %
Estimated CHD Risk: 1.4 times avg. — ABNORMAL HIGH (ref 0.0–1.0)
Free Thyroxine Index: 1.8 (ref 1.2–4.9)
GGT: 32 [IU]/L (ref 0–65)
Globulin, Total: 2 g/dL (ref 1.5–4.5)
Glucose: 100 mg/dL — ABNORMAL HIGH (ref 70–99)
HDL: 37 mg/dL — ABNORMAL LOW (ref >39)
Hematocrit: 42.8 % (ref 37.5–51.0)
Hemoglobin: 14.4 g/dL (ref 13.0–17.7)
Immature Grans (Abs): 0 10*3/uL (ref 0.0–0.1)
Immature Granulocytes: 0 %
Iron: 98 ug/dL (ref 38–169)
LDH: 181 [IU]/L (ref 121–224)
LDL Chol Calc (NIH): 157 mg/dL — ABNORMAL HIGH (ref 0–99)
Lymphocytes Absolute: 1.5 10*3/uL (ref 0.7–3.1)
Lymphs: 36 %
MCH: 29.8 pg (ref 26.6–33.0)
MCHC: 33.6 g/dL (ref 31.5–35.7)
MCV: 89 fL (ref 79–97)
Monocytes Absolute: 0.5 10*3/uL (ref 0.1–0.9)
Monocytes: 11 %
Neutrophils Absolute: 2.2 10*3/uL (ref 1.4–7.0)
Neutrophils: 51 %
Phosphorus: 3.5 mg/dL (ref 2.8–4.1)
Platelets: 168 10*3/uL (ref 150–450)
Potassium: 4.4 mmol/L (ref 3.5–5.2)
RBC: 4.83 x10E6/uL (ref 4.14–5.80)
RDW: 12.8 % (ref 11.6–15.4)
Sodium: 142 mmol/L (ref 134–144)
T3 Uptake Ratio: 24 % (ref 24–39)
T4, Total: 7.6 ug/dL (ref 4.5–12.0)
TSH: 1.65 u[IU]/mL (ref 0.450–4.500)
Total Protein: 6.4 g/dL (ref 6.0–8.5)
Triglycerides: 273 mg/dL — ABNORMAL HIGH (ref 0–149)
Uric Acid: 4.8 mg/dL (ref 3.8–8.4)
VLDL Cholesterol Cal: 51 mg/dL — ABNORMAL HIGH (ref 5–40)
WBC: 4.2 10*3/uL (ref 3.4–10.8)
eGFR: 119 mL/min/{1.73_m2} (ref >59)

## 2023-11-07 ENCOUNTER — Encounter: Payer: 59 | Admitting: Physician Assistant

## 2023-11-12 ENCOUNTER — Ambulatory Visit: Payer: Self-pay | Admitting: Physician Assistant

## 2023-11-12 ENCOUNTER — Encounter: Payer: Self-pay | Admitting: Physician Assistant

## 2023-11-12 VITALS — BP 121/79 | HR 66 | Resp 14 | Ht 78.0 in | Wt 295.0 lb

## 2023-11-12 DIAGNOSIS — Z0000 Encounter for general adult medical examination without abnormal findings: Secondary | ICD-10-CM

## 2023-11-12 NOTE — Progress Notes (Signed)
 Pt presents today to complete physical, Pt denies any issues or concerns at this time/CL,RMA

## 2023-11-12 NOTE — Progress Notes (Addendum)
 [facility] ________________   None    (approximate)  I have reviewed the triage vital signs and the nursing notes.   HISTORY  Chief Complaint No chief complaint on file.  HPI David Luna is a 38 y.o. male patient presents annual physical exam.  Patient as well as concerns for elevated lipid profile.  Patient said he is compliant with statin medication.         Past Medical History:  Diagnosis Date   Asthma    As a small child. Pt reports he outgrew.    Elevated ALT measurement    Elevated lipids    HSV-2 seropositive    IBS (irritable bowel syndrome)    Increased BMI     Patient Active Problem List   Diagnosis Date Noted   Other seborrheic keratosis 08/15/2021   Sebaceous cyst 08/15/2021   Healthcare maintenance 12/08/2018   BMI 33.0-33.9,adult 12/08/2018   Herpes simplex type 2 infection 04/27/2015    Past Surgical History:  Procedure Laterality Date   WISDOM TOOTH EXTRACTION  38 years old    Prior to Admission medications   Medication Sig Start Date End Date Taking? Authorizing Provider  methylPREDNISolone (MEDROL DOSEPAK) 4 MG TBPK tablet Take Tapered dose as directed 07/08/23   Joni Reining, PA-C  Multiple Vitamin (MULTIVITAMIN) tablet Take 1 tablet by mouth daily.    [provider]  orphenadrine (NORFLEX) 100 MG tablet Take 1 tablet (100 mg total) by mouth 2 (two) times daily. 07/30/23   Joni Reining, PA-C  rosuvastatin (CRESTOR) 40 MG tablet Take 1 tablet (40 mg total) by mouth daily. 01/07/23   Joni Reining, PA-C  valACYclovir (VALTREX) 500 MG tablet Take 1 tablet (500 mg total) by mouth daily. 05/01/23   Joni Reining, PA-C    Allergies Patient has no known allergies.  Family History  Problem Relation Age of Onset   Hypertension Mother    Hypertension Father    Diabetes Maternal Grandfather     Social History Social History   Tobacco Use   Smoking status: Never   Smokeless  tobacco: Never  Vaping Use   Vaping status: Never Used  Substance Use Topics   Alcohol use: Yes    Alcohol/week: 5.0 standard drinks of alcohol    Types: 5 Cans of beer per week   Drug use: No    Review of Systems Constitutional: No fever/chills Eyes: No visual changes. ENT: No sore throat. Cardiovascular: Denies chest pain. Respiratory: Denies shortness of breath. Gastrointestinal: No abdominal pain.  No nausea, no vomiting.  No diarrhea.  No constipation. Genitourinary: Negative for dysuria. Musculoskeletal: Negative for back pain. Skin: Negative for rash. Neurological: Negative for headaches, focal weakness or numbness. Endocrine: Hyperlipidemia ____________________________________________   PHYSICAL EXAM:  VITAL SIGNS:  11/12/2023   11:21 AM    BP 121/79  Cuff Size Large  Pulse Rate 66  Weight 295 lb (133.8 kg)  Height 6\' 6"  (1.981 m)  Resp 14  SpO2 98 %   Other Vitals   BMI: 34.09 kg/m2  BSA: 2.71 m2   Constitutional: Alert and oriented. Well appearing and in no acute distress. Eyes: Conjunctivae are normal. PERRL. EOMI. Head: Atraumatic. Nose: No congestion/rhinnorhea. Mouth/Throat: Mucous membranes are moist.  Oropharynx non-erythematous. Neck: No stridor.  No cervical spine tenderness to palpation. Hematological/Lymphatic/Immunilogical: No cervical lymphadenopathy. Cardiovascular: Normal rate, regular rhythm. Grossly normal heart sounds.  Good peripheral circulation. Respiratory: Normal respiratory effort.  No retractions.  Lungs CTAB. Gastrointestinal: Soft and nontender. No distention. No abdominal bruits. No CVA tenderness. Genitourinary: Deferred Musculoskeletal: No lower extremity tenderness nor edema.  No joint effusions. Neurologic:  Normal speech and language. No gross focal neurologic deficits are appreciated. No gait instability. Skin:  Skin is warm, dry and intact. No rash noted. Psychiatric: Mood and affect are normal. Speech and behavior  are normal.  ____________________________________________   LABS         Component Ref Range & Units (hover) 8 d ago 11 mo ago 1 yr ago 2 yr ago 3 yr ago  Color, UA Light Yellow yellow yellow yellow Dark Yellow  Clarity, UA Clear clear clear clear Clear  Glucose, UA Negative Negative Negative Negative Negative  Bilirubin, UA Negative negative negative negative Negative  Ketones, UA Negative negative negative negative Negative  Spec Grav, UA <=1.005 Abnormal  1.020 1.020 1.010 >=1.030 Abnormal   Blood, UA Negative neg negative negative Negative  pH, UA 7.5 6.0 6.0 6.0 6.0  Protein, UA Negative Negative Negative Negative Negative  Urobilinogen, UA 0.2 0.2 0.2 0.2 0.2  Nitrite, UA Negative neg negative negative Negative  Leukocytes, UA Negative Negative Negative Negative Negative  Appearance    light   Odor                          Component Ref Range & Units (hover) 8 d ago (11/04/23) 5 mo ago (05/22/23) 11 mo ago (11/26/22) 1 yr ago (08/22/22) 1 yr ago (02/28/22) 1 yr ago (11/24/21) 2 yr ago (02/15/21)  Glucose 100 High   97   100 High  101 High  R  Uric Acid 4.8  4.5 CM   5.1 CM 4.7 CM  Comment:            Therapeutic target for gout patients: <6.0  BUN 12  12   11 12   Creatinine, Ser 0.73 Low   0.70 Low    0.73 Low  0.69 Low   eGFR 119  121   120 123  BUN/Creatinine Ratio 16  17   15 17   Sodium 142  142   139 139  Potassium 4.4  3.9   4.4 4.1  Chloride 101  101   101 102  Calcium 9.7  9.7   9.7 9.5  Phosphorus 3.5  3.8   3.6 3.1  Total Protein 6.4  6.9   6.9 6.6  Albumin 4.4  4.7   4.8 R 4.6 R  Globulin, Total 2.0  2.2   2.1 2.0  Bilirubin Total 0.4  0.4   0.3 0.3  Alkaline Phosphatase 57  58   61 55  LDH 181  157   153 157  AST 34  25   26 21   ALT 56 High   35   48 High  34  GGT 32  27   35 23  Iron 98  73   77 66  Cholesterol, Total 245 High  142 149 145 157 246 High  223 High   Triglycerides 273 High  122 171 High  173 High  134 239 High  162 High   HDL 37  Low  40 39 Low  37 Low  43 42 40  VLDL Cholesterol Cal 51 High  22 29 30 24  44 High  30  LDL Chol Calc (NIH) 157 High  80 81 78 90 160 High  153 High   Chol/HDL Ratio 6.6 High  3.6 CM 3.8 CM 3.9 CM 3.7 CM 5.9 High  CM 5.6 High  CM  Comment:                                   T. Chol/HDL Ratio                                             Men  Women                               1/2 Avg.Risk  3.4    3.3                                   Avg.Risk  5.0    4.4                                2X Avg.Risk  9.6    7.1                                3X Avg.Risk 23.4   11.0  Estimated CHD Risk 1.4 High   0.7 CM   1.2 High  CM 1.2 High  CM  Comment: The CHD Risk is based on the T. Chol/HDL ratio. Other factors affect CHD Risk such as hypertension, smoking, diabetes, severe obesity, and family history of premature CHD.  TSH 1.650  1.660   1.460 1.480  T4, Total 7.6  8.5   8.1 7.7  T3 Uptake Ratio 24  25   22  Low  23 Low   Free Thyroxine Index 1.8  2.1   1.8 1.8  WBC 4.2  5.4   6.2 5.0  RBC 4.83  5.08   5.21 5.01  Hemoglobin 14.4  15.3   15.3 14.9  Hematocrit 42.8  43.9   43.9 42.9  MCV 89  86   84 86  MCH 29.8  30.1   29.4 29.7  MCHC 33.6  34.9   34.9 34.7  RDW 12.8  12.3   12.3 12.6  Platelets 168  166   193 183  Neutrophils 51  60   55 58  Lymphs 36  30   35 34  Monocytes 11  8   8 8   Eos 1  1   1  0  Basos 1  1   1  0  Neutrophils Absolute 2.2  3.3   3.4 2.9  Lymphocytes Absolute 1.5  1.6   2.2 1.7  Monocytes Absolute 0.5  0.4   0.5 0.4  EOS (ABSOLUTE) 0.0  0.1   0.0 0.0  Basophils Absolute 0.0  0.0   0.0 0.0  Immature Granulocytes 0  0   0 0  Immature Grans (Abs) 0.0  0.0   0.0 0.0             ____________________________________________  EKG  Sinus bradycardia 57 bpm.  Nonspecific QRS widening. ____________________________________________    ____________________________________________   INITIAL IMPRESSION / ASSESSMENT AND PLAN  As part of my medical decision making, I  reviewed the following data  within the electronic MEDICAL RECORD NUMBER       No acute findings along physical exam and EKG.  Patient lipid profile was elevated.  Will repeat fasting lipid profile.  Continue previous medications.     ____________________________________________   FINAL CLINICAL IMPRESSION  Well exam  ED Discharge Orders     None        Note:  This document was prepared using Dragon voice recognition software and may include unintentional dictation errors.

## 2023-11-13 ENCOUNTER — Other Ambulatory Visit: Payer: Self-pay

## 2023-11-13 DIAGNOSIS — E782 Mixed hyperlipidemia: Secondary | ICD-10-CM

## 2023-11-13 NOTE — Progress Notes (Signed)
Pt presents today to complete lipid panel. 

## 2023-11-14 LAB — LIPID PANEL
Chol/HDL Ratio: 4 {ratio} (ref 0.0–5.0)
Cholesterol, Total: 166 mg/dL (ref 100–199)
HDL: 41 mg/dL (ref >39)
LDL Chol Calc (NIH): 97 mg/dL (ref 0–99)
Triglycerides: 160 mg/dL — ABNORMAL HIGH (ref 0–149)
VLDL Cholesterol Cal: 28 mg/dL (ref 5–40)

## 2024-02-03 ENCOUNTER — Other Ambulatory Visit: Payer: Self-pay

## 2024-02-03 DIAGNOSIS — E782 Mixed hyperlipidemia: Secondary | ICD-10-CM

## 2024-02-04 MED ORDER — ROSUVASTATIN CALCIUM 40 MG PO TABS: 40.0000 mg | ORAL_TABLET | Freq: Every day | ORAL | 3 refills | Status: AC

## 2024-04-01 DIAGNOSIS — D225 Melanocytic nevi of trunk: Secondary | ICD-10-CM | POA: Diagnosis not present

## 2024-04-01 DIAGNOSIS — R208 Other disturbances of skin sensation: Secondary | ICD-10-CM | POA: Diagnosis not present

## 2024-04-01 DIAGNOSIS — D2272 Melanocytic nevi of left lower limb, including hip: Secondary | ICD-10-CM | POA: Diagnosis not present

## 2024-04-01 DIAGNOSIS — L821 Other seborrheic keratosis: Secondary | ICD-10-CM | POA: Diagnosis not present

## 2024-04-01 DIAGNOSIS — L918 Other hypertrophic disorders of the skin: Secondary | ICD-10-CM | POA: Diagnosis not present

## 2024-04-01 DIAGNOSIS — L538 Other specified erythematous conditions: Secondary | ICD-10-CM | POA: Diagnosis not present

## 2024-04-01 DIAGNOSIS — D2262 Melanocytic nevi of left upper limb, including shoulder: Secondary | ICD-10-CM | POA: Diagnosis not present

## 2024-04-01 DIAGNOSIS — D2271 Melanocytic nevi of right lower limb, including hip: Secondary | ICD-10-CM | POA: Diagnosis not present

## 2024-04-01 DIAGNOSIS — D2261 Melanocytic nevi of right upper limb, including shoulder: Secondary | ICD-10-CM | POA: Diagnosis not present

## 2024-05-14 ENCOUNTER — Other Ambulatory Visit: Payer: Self-pay

## 2024-05-14 DIAGNOSIS — B009 Herpesviral infection, unspecified: Secondary | ICD-10-CM

## 2024-05-14 MED ORDER — VALACYCLOVIR HCL 500 MG PO TABS: 500.0000 mg | ORAL_TABLET | Freq: Every day | ORAL | 3 refills | Status: AC
